# Patient Record
Sex: Female | Born: 1980 | Hispanic: No | Marital: Married | Smoking: Never smoker
Health system: Southern US, Community
[De-identification: ages and names within clinical notes are randomized; demographics above are authoritative.]

## PROBLEM LIST (undated history)

## (undated) DIAGNOSIS — J189 Pneumonia, unspecified organism: Secondary | ICD-10-CM

## (undated) DIAGNOSIS — B1910 Unspecified viral hepatitis B without hepatic coma: Secondary | ICD-10-CM

## (undated) DIAGNOSIS — Z98891 History of uterine scar from previous surgery: Secondary | ICD-10-CM

## (undated) DIAGNOSIS — D509 Iron deficiency anemia, unspecified: Secondary | ICD-10-CM

## (undated) HISTORY — PX: COSMETIC SURGERY: SHX468

## (undated) HISTORY — DX: Pneumonia, unspecified organism: J18.9

---

## 2007-07-05 ENCOUNTER — Other Ambulatory Visit: Admission: RE | Admit: 2007-07-05 | Discharge: 2007-07-05 | Payer: Self-pay | Admitting: Gynecology

## 2007-07-10 LAB — HIV ANTIBODY (ROUTINE TESTING W REFLEX): HIV: NONREACTIVE

## 2007-08-14 ENCOUNTER — Ambulatory Visit (HOSPITAL_COMMUNITY): Admission: RE | Admit: 2007-08-14 | Discharge: 2007-08-14 | Payer: Self-pay | Admitting: Gynecology

## 2009-04-05 ENCOUNTER — Inpatient Hospital Stay (HOSPITAL_COMMUNITY): Admission: AD | Admit: 2009-04-05 | Discharge: 2009-04-08 | Payer: Self-pay | Admitting: Obstetrics

## 2010-09-09 LAB — ABO/RH: RH Type: POSITIVE

## 2010-09-09 LAB — TYPE AND SCREEN: Antibody Screen: NEGATIVE

## 2010-09-09 LAB — RUBELLA ANTIBODY, IGM: Rubella: IMMUNE

## 2010-09-15 LAB — HIV ANTIBODY (ROUTINE TESTING W REFLEX): HIV: NONREACTIVE

## 2010-12-11 LAB — COMPREHENSIVE METABOLIC PANEL
ALT: 23 U/L (ref 0–35)
Albumin: 2.4 g/dL — ABNORMAL LOW (ref 3.5–5.2)
Alkaline Phosphatase: 245 U/L — ABNORMAL HIGH (ref 39–117)
BUN: 6 mg/dL (ref 6–23)
CO2: 21 mEq/L (ref 19–32)
GFR calc Af Amer: >60 mL/min (ref >60)
GFR calc non Af Amer: >60 mL/min (ref >60)

## 2010-12-11 LAB — CBC
HCT: 25.7 % — ABNORMAL LOW (ref 36.0–46.0)
Hemoglobin: 8.6 g/dL — ABNORMAL LOW (ref 12.0–15.0)
MCHC: 33.6 g/dL (ref 30.0–36.0)
MCHC: 33.7 g/dL (ref 30.0–36.0)
MCV: 84.8 fL (ref 78.0–100.0)
Platelets: 101 10*3/uL — ABNORMAL LOW (ref 150–400)
RBC: 3.79 MIL/uL — ABNORMAL LOW (ref 3.87–5.11)
WBC: 10 10*3/uL (ref 4.0–10.5)

## 2010-12-11 LAB — LACTATE DEHYDROGENASE: LDH: 122 U/L (ref 94–250)

## 2010-12-11 LAB — RPR: RPR Ser Ql: NONREACTIVE

## 2010-12-15 LAB — RPR: RPR: NONREACTIVE

## 2010-12-29 ENCOUNTER — Other Ambulatory Visit (HOSPITAL_COMMUNITY): Payer: Self-pay | Admitting: Obstetrics

## 2011-01-06 ENCOUNTER — Ambulatory Visit: Payer: Medicaid Other | Admitting: Dietician

## 2011-01-06 ENCOUNTER — Ambulatory Visit (HOSPITAL_COMMUNITY)
Admission: RE | Admit: 2011-01-06 | Discharge: 2011-01-06 | Disposition: A | Discharge status: Home / Self Care | Payer: Medicaid Other | Source: Ambulatory Visit | Attending: Obstetrics | Admitting: Obstetrics

## 2011-01-06 ENCOUNTER — Encounter: Payer: Medicaid Other | Attending: Obstetrics | Admitting: Dietician

## 2011-01-06 DIAGNOSIS — O99810 Abnormal glucose complicating pregnancy: Secondary | ICD-10-CM | POA: Insufficient documentation

## 2011-01-18 NOTE — H&P (Signed)
NAMENIKKY, DUBA                ACCOUNT NO.:  [id number]   MEDICAL RECORD NO.:  [id number]          PATIENT TYPE:  INP   LOCATION:  9174                          FACILITY:  [facility]   PHYSICIAN:  [name], M.D.DATE OF BIRTH:  [birth year]   DATE OF ADMISSION:  04/05/2009  DATE OF DISCHARGE:                              HISTORY & PHYSICAL   The patient is a 30 year old gravida 1, EDC April 05, 2009.  Negative  GBS.  Membranes ruptured at 7 a.m. on April 05, 2009, and at 19, she was  7 cm, 100% vertex -2 to -3 station.  Fluid was clear.  The patient was  in good labor and made no further progress.  By 3 p.m., she was still  cervically unchanged.   PHYSICAL EXAMINATION:  GENERAL:  A well-developed female in labor.  HEENT:  Negative.  LUNGS:  Clear.  HEART:  Regular rhythm.  No murmurs or gallops.  BREASTS:  No masses.  ABDOMEN:  Term size uterus on ultrasound.  Estimated fetal weight was 9  pounds 3 ounces 2 weeks prior to admission.  Therefore, there was  supposed to be fetal macrosomia.  EXTREMITIES:  Negative.           ______________________________  [name], M.D.     BAM/MEDQ  D:  04/05/2009  T:  04/06/2009  Job:  [id number]

## 2011-01-18 NOTE — Op Note (Signed)
[name], Penny Bishop                ACCOUNT NO.:  [id number]   MEDICAL RECORD NO.:  [id number]          PATIENT TYPE:  INP   LOCATION:  9147                          FACILITY:  [facility]   PHYSICIAN:  [name], M.D.DATE OF BIRTH:  03-25-1981   DATE OF PROCEDURE:  DATE OF DISCHARGE:                               OPERATIVE REPORT   PREOPERATIVE DIAGNOSIS:  Intrauterine pregnancy at term with failure to  progress in labor and failure to descend.   POSTOPERATIVE DIAGNOSIS:  Intrauterine pregnancy at term with failure to  progress in labor and failure to descend.   SURGEON:  [name], MD   ANESTHESIA:  Epidural.   PROCEDURE:  The patient was placed in the operating table in supine  position.  Abdomen prepped and draped.  Bladder emptied with Foley  catheter.  Transverse suprapubic incision made and carried down to  rectus fascia.  Fascia cleaned incised length of the incision.  Recti  muscles retracted laterally.  Peritoneum incised longitudinally.  Transverse incision made in the visceral peritoneum above the bladder.  Bladder mobilized inferiorly.  Transverse lower uterine incision made.  Fluid was clear.  The patient delivered from the OP position of a female,  Apgar 9 and 9, weighing 7 pounds 8 ounces.  The team was in attendance.  The placenta was anterior fundal, removed manually and sent to Labor and  Delivery.  Uterine cavity was cleaned with dry laps.  Uterine incision  closed in one layer with continuous suture of #1 chromic.  Hemostasis  was satisfactory.  Bladder flap reattached with 2-0 chromic.  Uterus  well contracted.  Tubes and ovaries normal.  Abdomen was closed in  layers, peritoneum continuous suture of 0 chromic, fascia continuous  suture with Dexon, skin closed with subcuticular stitch of 4-0 Monocryl.  Blood loss 500 mL.  The patient tolerated the procedure well, taken to  recovery room in good condition.            ______________________________  [name], M.D.     BAM/MEDQ  D:  04/05/2009  T:  04/06/2009  Job:  [id number]

## 2011-01-18 NOTE — Discharge Summary (Signed)
NAMESALISHA, BARDSLEY                ACCOUNT NO.:  [id number]   MEDICAL RECORD NO.:  [id number]          PATIENT TYPE:  INP   LOCATION:  9147                          [facility]ILITY[facility]  [facility]   PHYSICIAN:  [name], M.D.DATE OF BIRTH:  02-25-1981   DATE OF ADMISSION:  04/05/2009  DATE OF DISCHARGE:  04/08/2009                               DISCHARGE SUMMARY   The patient is a 30 year old primigravida, [facility] April 05, 2009.  She was  admitted on August 1 in labor at 11:35 a.m.  Membranes ruptured at 7:00  a.m.  She was 7 cm, 100% vertex, -2 to -3 station on admission and  estimated fetal weight by ultrasound a week prior to admission gave an  estimated fetal weight of 9 pounds 4 ounces.  The patient was in  adequate labor and eventually underwent a C-section because of failure  to progress in labor.  She had a female Apgar 9 and 9 from the OP position  weighing 7 pounds 8 ounces.  Blood loss was 500 mL.  Postop, she did  well.  Her hemoglobin was 8.6.  She was discharged on the third  postoperative day, ambulatory on a regular diet, to see me in 6 weeks.   DISCHARGE DIAGNOSIS:  Status post primary low transverse cesarean  section at term for failure to progress in labor.           ______________________________  [name], M.D.     BAM/MEDQ  D:  04/08/2009  T:  04/08/2009  Job:  [id number]

## 2011-01-26 ENCOUNTER — Other Ambulatory Visit (HOSPITAL_COMMUNITY): Payer: Self-pay | Admitting: Obstetrics

## 2011-01-26 DIAGNOSIS — Z0489 Encounter for examination and observation for other specified reasons: Secondary | ICD-10-CM

## 2011-01-26 DIAGNOSIS — IMO0002 Reserved for concepts with insufficient information to code with codable children: Secondary | ICD-10-CM

## 2011-01-28 ENCOUNTER — Ambulatory Visit (HOSPITAL_COMMUNITY)
Admission: RE | Admit: 2011-01-28 | Discharge: 2011-01-28 | Disposition: A | Discharge status: Home / Self Care | Payer: Medicaid Other | Source: Ambulatory Visit | Attending: Obstetrics | Admitting: Obstetrics

## 2011-01-28 DIAGNOSIS — O99810 Abnormal glucose complicating pregnancy: Secondary | ICD-10-CM | POA: Insufficient documentation

## 2011-02-01 ENCOUNTER — Ambulatory Visit (HOSPITAL_COMMUNITY)
Admission: RE | Admit: 2011-02-01 | Discharge: 2011-02-01 | Disposition: A | Discharge status: Home / Self Care | Payer: Medicaid Other | Source: Ambulatory Visit | Attending: Obstetrics | Admitting: Obstetrics

## 2011-02-01 DIAGNOSIS — O99810 Abnormal glucose complicating pregnancy: Secondary | ICD-10-CM | POA: Insufficient documentation

## 2011-02-04 ENCOUNTER — Ambulatory Visit (HOSPITAL_COMMUNITY)
Admission: RE | Admit: 2011-02-04 | Discharge: 2011-02-04 | Disposition: A | Discharge status: Home / Self Care | Payer: Medicaid Other | Source: Ambulatory Visit | Attending: Obstetrics | Admitting: Obstetrics

## 2011-02-04 DIAGNOSIS — O99810 Abnormal glucose complicating pregnancy: Secondary | ICD-10-CM | POA: Insufficient documentation

## 2011-02-08 ENCOUNTER — Ambulatory Visit (HOSPITAL_COMMUNITY)
Admission: RE | Admit: 2011-02-08 | Discharge: 2011-02-08 | Disposition: A | Discharge status: Home / Self Care | Payer: Medicaid Other | Source: Ambulatory Visit | Attending: Obstetrics | Admitting: Obstetrics

## 2011-02-08 ENCOUNTER — Other Ambulatory Visit (HOSPITAL_COMMUNITY): Payer: Medicaid Other

## 2011-02-08 DIAGNOSIS — Z3689 Encounter for other specified antenatal screening: Secondary | ICD-10-CM | POA: Insufficient documentation

## 2011-02-08 DIAGNOSIS — IMO0002 Reserved for concepts with insufficient information to code with codable children: Secondary | ICD-10-CM

## 2011-02-08 DIAGNOSIS — O99810 Abnormal glucose complicating pregnancy: Secondary | ICD-10-CM | POA: Insufficient documentation

## 2011-02-08 DIAGNOSIS — Z0489 Encounter for examination and observation for other specified reasons: Secondary | ICD-10-CM

## 2011-02-11 ENCOUNTER — Ambulatory Visit (HOSPITAL_COMMUNITY)
Admission: RE | Admit: 2011-02-11 | Discharge: 2011-02-11 | Disposition: A | Discharge status: Home / Self Care | Payer: Medicaid Other | Source: Ambulatory Visit | Attending: Obstetrics | Admitting: Obstetrics

## 2011-02-11 DIAGNOSIS — O99810 Abnormal glucose complicating pregnancy: Secondary | ICD-10-CM | POA: Insufficient documentation

## 2011-02-15 ENCOUNTER — Ambulatory Visit (HOSPITAL_COMMUNITY)
Admission: RE | Admit: 2011-02-15 | Discharge: 2011-02-15 | Disposition: A | Discharge status: Home / Self Care | Payer: Medicaid Other | Source: Ambulatory Visit | Attending: Obstetrics | Admitting: Obstetrics

## 2011-02-15 DIAGNOSIS — O99810 Abnormal glucose complicating pregnancy: Secondary | ICD-10-CM | POA: Insufficient documentation

## 2011-02-18 ENCOUNTER — Ambulatory Visit (HOSPITAL_COMMUNITY)
Admission: RE | Admit: 2011-02-18 | Discharge: 2011-02-18 | Disposition: A | Discharge status: Home / Self Care | Payer: Medicaid Other | Source: Ambulatory Visit | Attending: Obstetrics | Admitting: Obstetrics

## 2011-02-18 DIAGNOSIS — O99810 Abnormal glucose complicating pregnancy: Secondary | ICD-10-CM | POA: Insufficient documentation

## 2011-02-18 LAB — GC/CHLAMYDIA PROBE AMP, GENITAL: Chlamydia: NEGATIVE

## 2011-02-22 ENCOUNTER — Ambulatory Visit (HOSPITAL_COMMUNITY)
Admission: RE | Admit: 2011-02-22 | Discharge: 2011-02-22 | Disposition: A | Discharge status: Home / Self Care | Payer: Medicaid Other | Source: Ambulatory Visit | Attending: Obstetrics | Admitting: Obstetrics

## 2011-02-22 DIAGNOSIS — O99810 Abnormal glucose complicating pregnancy: Secondary | ICD-10-CM | POA: Insufficient documentation

## 2011-02-25 ENCOUNTER — Ambulatory Visit (HOSPITAL_COMMUNITY)
Admission: RE | Admit: 2011-02-25 | Discharge: 2011-02-25 | Disposition: A | Discharge status: Home / Self Care | Payer: Medicaid Other | Source: Ambulatory Visit | Attending: Obstetrics | Admitting: Obstetrics

## 2011-02-25 ENCOUNTER — Other Ambulatory Visit (HOSPITAL_COMMUNITY): Payer: Self-pay | Admitting: Obstetrics

## 2011-02-25 DIAGNOSIS — O288 Other abnormal findings on antenatal screening of mother: Secondary | ICD-10-CM

## 2011-02-25 DIAGNOSIS — O99810 Abnormal glucose complicating pregnancy: Secondary | ICD-10-CM | POA: Insufficient documentation

## 2011-03-01 ENCOUNTER — Ambulatory Visit (HOSPITAL_COMMUNITY)
Admission: RE | Admit: 2011-03-01 | Discharge: 2011-03-01 | Disposition: A | Discharge status: Home / Self Care | Payer: Medicaid Other | Source: Ambulatory Visit | Attending: Obstetrics | Admitting: Obstetrics

## 2011-03-01 ENCOUNTER — Encounter (HOSPITAL_COMMUNITY): Payer: Self-pay

## 2011-03-01 DIAGNOSIS — Z3689 Encounter for other specified antenatal screening: Secondary | ICD-10-CM | POA: Insufficient documentation

## 2011-03-01 DIAGNOSIS — O288 Other abnormal findings on antenatal screening of mother: Secondary | ICD-10-CM

## 2011-03-01 DIAGNOSIS — O99810 Abnormal glucose complicating pregnancy: Secondary | ICD-10-CM | POA: Insufficient documentation

## 2011-03-04 ENCOUNTER — Ambulatory Visit (HOSPITAL_COMMUNITY)
Admission: RE | Admit: 2011-03-04 | Discharge: 2011-03-04 | Disposition: A | Discharge status: Home / Self Care | Payer: Medicaid Other | Source: Ambulatory Visit | Attending: Obstetrics | Admitting: Obstetrics

## 2011-03-04 DIAGNOSIS — O99810 Abnormal glucose complicating pregnancy: Secondary | ICD-10-CM | POA: Insufficient documentation

## 2011-03-08 ENCOUNTER — Ambulatory Visit (HOSPITAL_COMMUNITY)
Admission: RE | Admit: 2011-03-08 | Discharge: 2011-03-08 | Disposition: A | Discharge status: Home / Self Care | Payer: Medicaid Other | Source: Ambulatory Visit | Attending: Obstetrics | Admitting: Obstetrics

## 2011-03-08 DIAGNOSIS — O99810 Abnormal glucose complicating pregnancy: Secondary | ICD-10-CM | POA: Insufficient documentation

## 2011-03-11 ENCOUNTER — Ambulatory Visit (HOSPITAL_COMMUNITY)
Admission: RE | Admit: 2011-03-11 | Discharge: 2011-03-11 | Disposition: A | Discharge status: Home / Self Care | Payer: Medicaid Other | Source: Ambulatory Visit | Attending: Obstetrics | Admitting: Obstetrics

## 2011-03-11 DIAGNOSIS — O99810 Abnormal glucose complicating pregnancy: Secondary | ICD-10-CM | POA: Insufficient documentation

## 2011-03-11 DIAGNOSIS — O34219 Maternal care for unspecified type scar from previous cesarean delivery: Secondary | ICD-10-CM | POA: Insufficient documentation

## 2011-03-11 DIAGNOSIS — O288 Other abnormal findings on antenatal screening of mother: Secondary | ICD-10-CM

## 2011-03-15 ENCOUNTER — Ambulatory Visit (HOSPITAL_COMMUNITY)
Admission: RE | Admit: 2011-03-15 | Discharge: 2011-03-15 | Disposition: A | Discharge status: Home / Self Care | Payer: Medicaid Other | Source: Ambulatory Visit | Attending: Obstetrics | Admitting: Obstetrics

## 2011-03-15 ENCOUNTER — Encounter (HOSPITAL_COMMUNITY): Payer: Self-pay

## 2011-03-15 DIAGNOSIS — O288 Other abnormal findings on antenatal screening of mother: Secondary | ICD-10-CM

## 2011-03-15 DIAGNOSIS — Z3689 Encounter for other specified antenatal screening: Secondary | ICD-10-CM | POA: Insufficient documentation

## 2011-03-15 DIAGNOSIS — O34219 Maternal care for unspecified type scar from previous cesarean delivery: Secondary | ICD-10-CM | POA: Insufficient documentation

## 2011-03-15 DIAGNOSIS — O99810 Abnormal glucose complicating pregnancy: Secondary | ICD-10-CM | POA: Insufficient documentation

## 2011-03-15 NOTE — ED Notes (Signed)
Pt here for NST and [place].  Reports positive fetal movement and denies any complaints.

## 2011-03-18 ENCOUNTER — Encounter (HOSPITAL_COMMUNITY): Payer: Self-pay | Admitting: *Deleted

## 2011-03-18 ENCOUNTER — Inpatient Hospital Stay (HOSPITAL_COMMUNITY)
Admission: AD | Admit: 2011-03-18 | Discharge: 2011-03-23 | DRG: 765 | Disposition: A | Discharge status: Home / Self Care | Payer: Medicaid Other | Source: Ambulatory Visit | Attending: Obstetrics | Admitting: Obstetrics

## 2011-03-18 ENCOUNTER — Ambulatory Visit (HOSPITAL_COMMUNITY)
Admission: RE | Admit: 2011-03-18 | Discharge: 2011-03-18 | Disposition: A | Discharge status: Home / Self Care | Payer: Medicaid Other | Source: Ambulatory Visit | Attending: Obstetrics | Admitting: Obstetrics

## 2011-03-18 ENCOUNTER — Inpatient Hospital Stay (HOSPITAL_COMMUNITY): Admission: RE | Admit: 2011-03-18 | Payer: Medicaid Other | Source: Ambulatory Visit

## 2011-03-18 DIAGNOSIS — O330 Maternal care for disproportion due to deformity of maternal pelvic bones: Principal | ICD-10-CM | POA: Diagnosis present

## 2011-03-18 DIAGNOSIS — O99814 Abnormal glucose complicating childbirth: Secondary | ICD-10-CM | POA: Diagnosis present

## 2011-03-18 DIAGNOSIS — O99810 Abnormal glucose complicating pregnancy: Secondary | ICD-10-CM | POA: Insufficient documentation

## 2011-03-18 DIAGNOSIS — Z98891 History of uterine scar from previous surgery: Secondary | ICD-10-CM

## 2011-03-18 DIAGNOSIS — O339 Maternal care for disproportion, unspecified: Secondary | ICD-10-CM | POA: Diagnosis present

## 2011-03-18 DIAGNOSIS — O34219 Maternal care for unspecified type scar from previous cesarean delivery: Secondary | ICD-10-CM | POA: Diagnosis present

## 2011-03-18 DIAGNOSIS — O24410 Gestational diabetes mellitus in pregnancy, diet controlled: Secondary | ICD-10-CM

## 2011-03-18 HISTORY — DX: History of uterine scar from previous surgery: Z98.891

## 2011-03-18 LAB — CBC
HCT: 32.3 % — ABNORMAL LOW (ref 36.0–46.0)
MCH: 26.4 pg (ref 26.0–34.0)
MCHC: 32.2 g/dL (ref 30.0–36.0)
MCV: 82 fL (ref 78.0–100.0)
Platelets: 150 10*3/uL (ref 150–400)
RBC: 3.94 MIL/uL (ref 3.87–5.11)
RDW: 17.3 % — ABNORMAL HIGH (ref 11.5–15.5)

## 2011-03-18 MED ORDER — LIDOCAINE HCL (PF) 1 % IJ SOLN
30.0000 mL | INTRAMUSCULAR | Status: DC | PRN
  Filled 2011-03-18: qty 30

## 2011-03-18 MED ORDER — PHENYLEPHRINE 40 MCG/ML (10ML) SYRINGE FOR IV PUSH (FOR BLOOD PRESSURE SUPPORT): 80.0000 ug | PREFILLED_SYRINGE | INTRAVENOUS | Status: DC | PRN

## 2011-03-18 MED ORDER — LACTATED RINGERS IV SOLN
500.0000 mL | Freq: Once | INTRAVENOUS | Status: AC
  Administered 2011-03-19: 500 mL via INTRAVENOUS

## 2011-03-18 MED ORDER — FENTANYL 2.5 MCG/ML BUPIVACAINE 1/10 % EPIDURAL INFUSION (WH - ANES)
2.0000 mL/h | INTRAMUSCULAR | Status: DC
  Administered 2011-03-19 (×7): 12 mL/h via EPIDURAL
  Filled 2011-03-18 (×7): qty 60

## 2011-03-18 MED ORDER — ONDANSETRON HCL 4 MG/2ML IJ SOLN: 4.0000 mg | Freq: Four times a day (QID) | INTRAMUSCULAR | Status: DC | PRN

## 2011-03-18 MED ORDER — EPHEDRINE 5 MG/ML INJ: 10.0000 mg | INTRAVENOUS | Status: DC | PRN

## 2011-03-18 MED ORDER — NALBUPHINE HCL 10 MG/ML IJ SOLN: 10.0000 mg | INTRAMUSCULAR | Status: DC | PRN

## 2011-03-18 MED ORDER — LACTATED RINGERS IV SOLN
INTRAVENOUS | Status: DC
  Administered 2011-03-18 – 2011-03-19 (×2): via INTRAVENOUS

## 2011-03-18 MED ORDER — TERBUTALINE SULFATE 1 MG/ML IJ SOLN: 0.2500 mg | Freq: Once | INTRAMUSCULAR | Status: AC | PRN

## 2011-03-18 MED ORDER — ACETAMINOPHEN 325 MG PO TABS
650.0000 mg | ORAL_TABLET | ORAL | Status: DC | PRN
  Administered 2011-03-19: 650 mg via ORAL
  Filled 2011-03-18: qty 2

## 2011-03-18 MED ORDER — CITRIC ACID-SODIUM CITRATE 334-500 MG/5ML PO SOLN
30.0000 mL | ORAL | Status: DC | PRN
  Administered 2011-03-18 – 2011-03-20 (×2): 30 mL via ORAL
  Filled 2011-03-18 (×3): qty 15

## 2011-03-18 MED ORDER — EPHEDRINE 5 MG/ML INJ
10.0000 mg | INTRAVENOUS | Status: DC | PRN
  Filled 2011-03-18: qty 4

## 2011-03-18 MED ORDER — OXYTOCIN 20 UNITS IN LACTATED RINGERS INFUSION - SIMPLE
1.0000 m[IU]/min | INTRAVENOUS | Status: DC
  Administered 2011-03-18: 1 m[IU]/min via INTRAVENOUS
  Filled 2011-03-18: qty 1000

## 2011-03-18 MED ORDER — PHENYLEPHRINE 40 MCG/ML (10ML) SYRINGE FOR IV PUSH (FOR BLOOD PRESSURE SUPPORT)
80.0000 ug | PREFILLED_SYRINGE | INTRAVENOUS | Status: DC | PRN
  Filled 2011-03-18: qty 5

## 2011-03-18 MED ORDER — OXYCODONE-ACETAMINOPHEN 5-325 MG PO TABS: 2.0000 | ORAL_TABLET | ORAL | Status: DC | PRN

## 2011-03-18 MED ORDER — LACTATED RINGERS IV SOLN
500.0000 mL | INTRAVENOUS | Status: DC | PRN
  Administered 2011-03-19: 1000 mL via INTRAVENOUS

## 2011-03-18 MED ORDER — IBUPROFEN 600 MG PO TABS: 600.0000 mg | ORAL_TABLET | Freq: Four times a day (QID) | ORAL | Status: DC | PRN

## 2011-03-18 MED ORDER — DIPHENHYDRAMINE HCL 50 MG/ML IJ SOLN
12.5000 mg | INTRAMUSCULAR | Status: DC | PRN
Start: 2011-03-18 — End: 2011-03-20

## 2011-03-18 NOTE — Plan of Care (Signed)
Problem: Consults Goal: Birthing Suites Patient Information Press F2 to bring up selections list Outcome: Completed/Met Date Met:  03/18/11  Pt 37-[redacted] weeks EGA, Inpatient induction and Diabetic

## 2011-03-19 ENCOUNTER — Encounter (HOSPITAL_COMMUNITY): Payer: Self-pay | Admitting: *Deleted

## 2011-03-19 ENCOUNTER — Encounter (HOSPITAL_COMMUNITY): Payer: Self-pay | Admitting: Anesthesiology

## 2011-03-19 LAB — RPR: RPR Ser Ql: NONREACTIVE

## 2011-03-19 MED ORDER — FAMOTIDINE 20 MG PO TABS
20.0000 mg | ORAL_TABLET | Freq: Two times a day (BID) | ORAL | Status: DC
  Administered 2011-03-19 (×2): 20 mg via ORAL
  Filled 2011-03-19 (×2): qty 1

## 2011-03-19 MED ORDER — NALBUPHINE SYRINGE 5 MG/0.5 ML
10.0000 mg | INJECTION | INTRAMUSCULAR | Status: DC | PRN
  Administered 2011-03-19: 10 mg via INTRAVENOUS
  Filled 2011-03-19: qty 1

## 2011-03-19 MED ORDER — PHENYLEPHRINE 40 MCG/ML (10ML) SYRINGE FOR IV PUSH (FOR BLOOD PRESSURE SUPPORT): 80.0000 ug | PREFILLED_SYRINGE | INTRAVENOUS | Status: DC | PRN

## 2011-03-19 MED ORDER — LACTATED RINGERS IV SOLN: 500.0000 mL | Freq: Once | INTRAVENOUS | Status: DC

## 2011-03-19 MED ORDER — LIDOCAINE HCL 1.5 % IJ SOLN
INTRAMUSCULAR | Status: DC | PRN
  Administered 2011-03-19: 4 mL via INTRADERMAL
  Administered 2011-03-19: 3 mL

## 2011-03-19 MED ORDER — EPHEDRINE 5 MG/ML INJ: 10.0000 mg | INTRAVENOUS | Status: DC | PRN

## 2011-03-19 MED ORDER — AMPICILLIN SODIUM 2 G IJ SOLR: 2.0000 g | Freq: Four times a day (QID) | INTRAMUSCULAR | Status: DC

## 2011-03-19 MED ORDER — FENTANYL 2.5 MCG/ML BUPIVACAINE 1/10 % EPIDURAL INFUSION (WH - ANES): 2.0000 mL/h | INTRAMUSCULAR | Status: DC

## 2011-03-19 MED ORDER — SODIUM CHLORIDE 0.9 % IV SOLN
2.0000 g | Freq: Four times a day (QID) | INTRAVENOUS | Status: DC
  Administered 2011-03-19: 2 g via INTRAVENOUS
  Filled 2011-03-19: qty 2000

## 2011-03-19 MED ORDER — LACTATED RINGERS IV SOLN: 500.0000 mL | INTRAVENOUS | Status: DC | PRN

## 2011-03-19 NOTE — Progress Notes (Signed)
  Patient's cervix is now 3-4 cm dilated 90% effaced Vertex at -2-3 station She is contracting every 2-3 minutes good quality

## 2011-03-19 NOTE — Anesthesia Preprocedure Evaluation (Addendum)
Anesthesia Evaluation  Name, MR# and DOB Patient awake  General Assessment Comment  Reviewed: Allergy & Precautions, H&P  and Patient's Chart, lab work & pertinent test results  Airway Mallampati: III TM Distance: >3 FB Neck ROM: full    Dental No notable dental hx (+) Teeth Intact   Pulmonary    pulmonary exam normal   Cardiovascular regular Normal   Neuro/PsychNegative Neurological ROS Negative Psych ROS  GI/Hepatic/Renal negative GI ROS, negative Liver ROS, and negative Renal ROS (+)       Endo/Other  Negative Endocrine ROS (+)   Abdominal   Musculoskeletal  Hematology negative hematology ROS (+)   Peds  Reproductive/Obstetrics (+) Pregnancy   Anesthesia Other Findings             Anesthesia Physical Anesthesia Plan  ASA: II  Anesthesia Plan: Epidural   Post-op Pain Management:    Induction:   Airway Management Planned:   Additional Equipment:   Intra-op Plan:   Post-operative Plan:   Informed Consent: I have reviewed the patients History and Physical, chart, labs and discussed the procedure including the risks, benefits and alternatives for the proposed anesthesia with the patient or authorized representative who has indicated his/her understanding and acceptance.     Plan Discussed with: Anesthesiologist  Anesthesia Plan Comments:         Anesthesia Quick Evaluation

## 2011-03-19 NOTE — H&P (Signed)
This is Dr. Francoise Ceo dictating the admitting note on Penny Bishop She's a 30 year old gravida 3 para 1011 EDC 03/22/2011 The patient is a diabetic controlled on diet and her sugars have been normal She had a previous C-section for CPD And is now admitted for induction of labor at 39+ weeks or G. Her GBS was negative and her HIV negative Past medical history she had a previous C-section Social history denies smoking drinking or alcohol abuse Family history noncontributory System review noncontributory Physical HEENT negative Breasts negative Heart regular rhythm no murmurs no gallops Lungs clear to P&A Abdomen term size with an estimated fetal weight of 8 pounds Cervix was 2 cm 80% and the vertex -2-3 station The membranes were ruptured artificially and the fluid was clear

## 2011-03-19 NOTE — Anesthesia Procedure Notes (Signed)
Epidural Patient location during procedure: OB Start time: 03/19/2011 2:12 AM  Staffing Anesthesiologist: Kahner Yanik A. Performed by: anesthesiologist   Preanesthetic Checklist Completed: patient identified, site marked, surgical consent, pre-op evaluation, timeout performed, IV checked, risks and benefits discussed and monitors and equipment checked  Epidural Patient position: sitting Prep: site prepped and draped and DuraPrep Patient monitoring: continuous pulse ox and blood pressure Approach: midline Injection technique: LOR air  Needle Needle type: Tuohy  Needle gauge: 17 G Needle length: 9 cm Catheter type: closed end flexible Catheter size: 19 Gauge Test dose: negative and 1.5% lidocaine  Assessment Events: blood not aspirated, injection not painful, no injection resistance, negative IV test and no paresthesia

## 2011-03-20 ENCOUNTER — Encounter (HOSPITAL_COMMUNITY): Payer: Self-pay | Admitting: Anesthesiology

## 2011-03-20 ENCOUNTER — Encounter (HOSPITAL_COMMUNITY): Payer: Self-pay | Admitting: Neonatology

## 2011-03-20 ENCOUNTER — Inpatient Hospital Stay (HOSPITAL_COMMUNITY): Payer: Medicaid Other | Admitting: Anesthesiology

## 2011-03-20 ENCOUNTER — Other Ambulatory Visit: Payer: Self-pay | Admitting: Obstetrics

## 2011-03-20 ENCOUNTER — Encounter (HOSPITAL_COMMUNITY)
Admission: AD | Disposition: A | Discharge status: Home / Self Care | Payer: Self-pay | Source: Ambulatory Visit | Attending: Obstetrics

## 2011-03-20 DIAGNOSIS — O34219 Maternal care for unspecified type scar from previous cesarean delivery: Secondary | ICD-10-CM | POA: Diagnosis present

## 2011-03-20 DIAGNOSIS — O24410 Gestational diabetes mellitus in pregnancy, diet controlled: Secondary | ICD-10-CM

## 2011-03-20 SURGERY — Surgical Case
Anesthesia: Regional | Site: Abdomen | Wound class: Clean Contaminated

## 2011-03-20 MED ORDER — NALOXONE HCL 0.4 MG/ML IJ SOLN: 0.4000 mg | INTRAMUSCULAR | Status: DC | PRN

## 2011-03-20 MED ORDER — CEFAZOLIN SODIUM 1-5 GM-% IV SOLN
1.0000 g | Freq: Three times a day (TID) | INTRAVENOUS | Status: AC
  Administered 2011-03-20 (×2): 1 g via INTRAVENOUS
  Filled 2011-03-20 (×3): qty 50

## 2011-03-20 MED ORDER — SODIUM BICARBONATE 8.4 % IV SOLN
INTRAVENOUS | Status: DC | PRN
  Administered 2011-03-19: 10 mL via EPIDURAL

## 2011-03-20 MED ORDER — SODIUM CHLORIDE 0.9 % IJ SOLN: 9.0000 mL | INTRAMUSCULAR | Status: DC | PRN

## 2011-03-20 MED ORDER — PRENATAL VITAMINS (DIS) PO TABS: 1.0000 | ORAL_TABLET | ORAL | Status: DC

## 2011-03-20 MED ORDER — SIMETHICONE 80 MG PO CHEW: 80.0000 mg | CHEWABLE_TABLET | ORAL | Status: DC | PRN

## 2011-03-20 MED ORDER — ONDANSETRON HCL 4 MG PO TABS: 4.0000 mg | ORAL_TABLET | ORAL | Status: DC | PRN

## 2011-03-20 MED ORDER — OXYTOCIN 10 UNIT/ML IJ SOLN
INTRAMUSCULAR | Status: AC
  Filled 2011-03-20: qty 4

## 2011-03-20 MED ORDER — LIDOCAINE-EPINEPHRINE (PF) 2 %-1:200000 IJ SOLN
INTRAMUSCULAR | Status: AC
  Filled 2011-03-20: qty 20

## 2011-03-20 MED ORDER — MORPHINE SULFATE 0.5 MG/ML IJ SOLN
INTRAMUSCULAR | Status: AC
  Filled 2011-03-20: qty 10

## 2011-03-20 MED ORDER — LACTATED RINGERS IV SOLN
INTRAVENOUS | Status: DC | PRN
  Administered 2011-03-20 (×2): via INTRAVENOUS

## 2011-03-20 MED ORDER — ZOLPIDEM TARTRATE 5 MG PO TABS: 5.0000 mg | ORAL_TABLET | Freq: Every evening | ORAL | Status: DC | PRN

## 2011-03-20 MED ORDER — FENTANYL CITRATE 0.05 MG/ML IJ SOLN
INTRAMUSCULAR | Status: DC | PRN
  Administered 2011-03-20 (×2): 50 ug via INTRAVENOUS

## 2011-03-20 MED ORDER — CEFAZOLIN SODIUM 1 G IJ SOLR: 1.0000 g | Freq: Three times a day (TID) | INTRAMUSCULAR | Status: DC

## 2011-03-20 MED ORDER — FENTANYL CITRATE 0.05 MG/ML IJ SOLN
INTRAMUSCULAR | Status: AC
  Filled 2011-03-20: qty 2

## 2011-03-20 MED ORDER — OXYTOCIN 20 UNITS IN LACTATED RINGERS INFUSION - SIMPLE
INTRAVENOUS | Status: DC | PRN
  Administered 2011-03-20: 20 [IU] via INTRAVENOUS

## 2011-03-20 MED ORDER — PHENYLEPHRINE 40 MCG/ML (10ML) SYRINGE FOR IV PUSH (FOR BLOOD PRESSURE SUPPORT)
PREFILLED_SYRINGE | INTRAVENOUS | Status: AC
  Filled 2011-03-20: qty 10

## 2011-03-20 MED ORDER — OXYCODONE-ACETAMINOPHEN 5-325 MG PO TABS
1.0000 | ORAL_TABLET | ORAL | Status: DC | PRN
  Administered 2011-03-21 – 2011-03-23 (×5): 1 via ORAL
  Filled 2011-03-20 (×5): qty 1

## 2011-03-20 MED ORDER — ONDANSETRON HCL 4 MG/2ML IJ SOLN: 4.0000 mg | INTRAMUSCULAR | Status: DC | PRN

## 2011-03-20 MED ORDER — HYDROMORPHONE HCL 2 MG/ML IJ SOLN: 2.0000 mg | Freq: Once | INTRAMUSCULAR | Status: DC

## 2011-03-20 MED ORDER — HYDROMORPHONE HCL 1 MG/ML IJ SOLN
0.5000 mg | INTRAMUSCULAR | Status: DC | PRN
  Administered 2011-03-20: 0.5 mg via INTRAVENOUS
  Administered 2011-03-20: 1 mg via INTRAVENOUS

## 2011-03-20 MED ORDER — PRENATAL PLUS 27-1 MG PO TABS
1.0000 | ORAL_TABLET | Freq: Every day | ORAL | Status: DC
  Administered 2011-03-20: 1 via ORAL

## 2011-03-20 MED ORDER — SODIUM CHLORIDE 0.9 % IV SOLN
150.0000 mg | INTRAVENOUS | Status: DC | PRN
  Administered 2011-03-20: 25 mg via INTRAVENOUS

## 2011-03-20 MED ORDER — OXYTOCIN 20 UNITS IN LACTATED RINGERS INFUSION - SIMPLE: 125.0000 mL/h | INTRAVENOUS | Status: AC

## 2011-03-20 MED ORDER — HYDROMORPHONE 0.3 MG/ML IV SOLN
INTRAVENOUS | Status: AC
  Filled 2011-03-20: qty 25

## 2011-03-20 MED ORDER — MENTHOL 3 MG MT LOZG: 1.0000 | LOZENGE | OROMUCOSAL | Status: DC | PRN

## 2011-03-20 MED ORDER — MORPHINE SULFATE (PF) 0.5 MG/ML IJ SOLN
INTRAMUSCULAR | Status: DC | PRN
  Administered 2011-03-20: 4 mg via EPIDURAL
  Administered 2011-03-20: 1 mg via INTRAVENOUS

## 2011-03-20 MED ORDER — SENNOSIDES-DOCUSATE SODIUM 8.6-50 MG PO TABS
1.0000 | ORAL_TABLET | Freq: Every day | ORAL | Status: DC
  Administered 2011-03-20 – 2011-03-21 (×2): 1 via ORAL
  Administered 2011-03-22: 2 via ORAL

## 2011-03-20 MED ORDER — DIPHENHYDRAMINE HCL 25 MG PO CAPS: 25.0000 mg | ORAL_CAPSULE | Freq: Four times a day (QID) | ORAL | Status: DC | PRN

## 2011-03-20 MED ORDER — CEFAZOLIN SODIUM 1-5 GM-% IV SOLN
INTRAVENOUS | Status: DC | PRN
  Administered 2011-03-20: 1 g via INTRAVENOUS

## 2011-03-20 MED ORDER — PRENATAL PLUS 27-1 MG PO TABS
1.0000 | ORAL_TABLET | Freq: Every day | ORAL | Status: DC
  Administered 2011-03-20 – 2011-03-23 (×4): 1 via ORAL
  Filled 2011-03-20 (×4): qty 1

## 2011-03-20 MED ORDER — IBUPROFEN 200 MG PO TABS
600.0000 mg | ORAL_TABLET | Freq: Four times a day (QID) | ORAL | Status: DC
  Administered 2011-03-20 – 2011-03-23 (×11): 600 mg via ORAL
  Filled 2011-03-20: qty 3
  Filled 2011-03-20 (×5): qty 1
  Filled 2011-03-20 (×2): qty 3
  Filled 2011-03-20 (×7): qty 1

## 2011-03-20 MED ORDER — SODIUM CHLORIDE 0.9 % IV SOLN: 2.0000 mg/h | Freq: Once | INTRAVENOUS | Status: DC

## 2011-03-20 MED ORDER — ONDANSETRON HCL 4 MG/2ML IJ SOLN
INTRAMUSCULAR | Status: DC | PRN
  Administered 2011-03-20: 4 mg via INTRAVENOUS

## 2011-03-20 MED ORDER — FAMOTIDINE 10 MG PO TABS
10.0000 mg | ORAL_TABLET | Freq: Two times a day (BID) | ORAL | Status: DC
  Administered 2011-03-20 (×2): 10 mg via ORAL
  Filled 2011-03-20: qty 1
  Filled 2011-03-20: qty 0.5
  Filled 2011-03-20 (×2): qty 1

## 2011-03-20 MED ORDER — PHENYLEPHRINE HCL 10 MG/ML IJ SOLN
INTRAMUSCULAR | Status: DC | PRN
  Administered 2011-03-20: 80 ug via INTRAVENOUS
  Administered 2011-03-20: 40 ug via INTRAVENOUS
  Administered 2011-03-20 (×2): 80 ug via INTRAVENOUS
  Administered 2011-03-20: 120 ug via INTRAVENOUS

## 2011-03-20 MED ORDER — HYDROMORPHONE HCL 1 MG/ML IJ SOLN
INTRAMUSCULAR | Status: AC
Start: 2011-03-20 — End: 2011-03-20
  Administered 2011-03-20: 1 mg via INTRAVENOUS
  Filled 2011-03-20: qty 1

## 2011-03-20 MED ORDER — ONDANSETRON HCL 4 MG/2ML IJ SOLN: 4.0000 mg | Freq: Four times a day (QID) | INTRAMUSCULAR | Status: DC | PRN

## 2011-03-20 MED ORDER — MEPERIDINE HCL 25 MG/ML IJ SOLN
INTRAMUSCULAR | Status: AC
  Filled 2011-03-20: qty 1

## 2011-03-20 MED ORDER — LIDOCAINE HCL (PF) 2 % IJ SOLN
INTRAMUSCULAR | Status: DC | PRN
  Administered 2011-03-20: 100 mg

## 2011-03-20 MED ORDER — ONDANSETRON HCL 4 MG/2ML IJ SOLN
INTRAMUSCULAR | Status: AC
  Filled 2011-03-20: qty 2

## 2011-03-20 MED ORDER — DIPHENHYDRAMINE HCL 50 MG/ML IJ SOLN: 12.5000 mg | Freq: Four times a day (QID) | INTRAMUSCULAR | Status: DC | PRN

## 2011-03-20 MED ORDER — HYDROMORPHONE HCL 1 MG/ML IJ SOLN
INTRAMUSCULAR | Status: AC
  Administered 2011-03-20: 0.5 mg via INTRAVENOUS
  Filled 2011-03-20: qty 1

## 2011-03-20 MED ORDER — DIPHENHYDRAMINE HCL 12.5 MG/5ML PO ELIX
12.5000 mg | ORAL_SOLUTION | Freq: Four times a day (QID) | ORAL | Status: DC | PRN
  Filled 2011-03-20: qty 5

## 2011-03-20 MED ORDER — CEFAZOLIN SODIUM 1-5 GM-% IV SOLN
INTRAVENOUS | Status: AC
  Filled 2011-03-20: qty 50

## 2011-03-20 MED ORDER — SIMETHICONE 80 MG PO CHEW
80.0000 mg | CHEWABLE_TABLET | Freq: Three times a day (TID) | ORAL | Status: DC
  Administered 2011-03-20 – 2011-03-23 (×13): 80 mg via ORAL

## 2011-03-20 MED ORDER — HYDROMORPHONE 0.3 MG/ML IV SOLN
INTRAVENOUS | Status: DC
  Administered 2011-03-20: 07:00:00 via INTRAVENOUS
  Administered 2011-03-20: 2.7 mg via INTRAVENOUS
  Administered 2011-03-20: 1.2 mg via INTRAVENOUS
  Administered 2011-03-20: 10 mg via INTRAVENOUS
  Administered 2011-03-21: 0.6 mg via INTRAVENOUS
  Administered 2011-03-21: 0.3 mg via INTRAVENOUS

## 2011-03-20 MED ORDER — CALCIUM CARBONATE ANTACID 500 MG PO CHEW
2.0000 | CHEWABLE_TABLET | Freq: Three times a day (TID) | ORAL | Status: DC
  Administered 2011-03-20 – 2011-03-23 (×4): 400 mg via ORAL
  Filled 2011-03-20 (×4): qty 2

## 2011-03-20 SURGICAL SUPPLY — 32 items
CLOTH BEACON ORANGE TIMEOUT ST (SAFETY) ×2 IMPLANT
CONTAINER PREFILL 10% NBF 15ML (MISCELLANEOUS) IMPLANT
DERMABOND ADVANCED (GAUZE/BANDAGES/DRESSINGS) ×2 IMPLANT
DRAPE UTILITY XL STRL (DRAPES) ×2 IMPLANT
DRESSING TELFA 8X3 (GAUZE/BANDAGES/DRESSINGS) IMPLANT
ELECT REM PT RETURN 9FT ADLT (ELECTROSURGICAL) ×2
ELECTRODE REM PT RTRN 9FT ADLT (ELECTROSURGICAL) ×1 IMPLANT
EXTRACTOR VACUUM M CUP 4 TUBE (SUCTIONS) IMPLANT
GAUZE SPONGE 4X4 12PLY STRL LF (GAUZE/BANDAGES/DRESSINGS) IMPLANT
GLOVE BIO SURGEON STRL SZ8.5 (GLOVE) ×4 IMPLANT
GLOVE BIOGEL PI IND STRL 7.5 (GLOVE) ×1 IMPLANT
GLOVE BIOGEL PI INDICATOR 7.5 (GLOVE) ×1
GOWN BRE IMP SLV AUR LG STRL (GOWN DISPOSABLE) ×4 IMPLANT
GOWN STRL REIN 2XL LVL4 (GOWN DISPOSABLE) ×2 IMPLANT
KIT ABG SYR 3ML LUER SLIP (SYRINGE) ×2 IMPLANT
NEEDLE HYPO 25X5/8 SAFETYGLIDE (NEEDLE) ×2 IMPLANT
NS IRRIG 1000ML POUR BTL (IV SOLUTION) ×2 IMPLANT
PACK C SECTION WH (CUSTOM PROCEDURE TRAY) ×2 IMPLANT
PAD ABD 7.5X8 STRL (GAUZE/BANDAGES/DRESSINGS) IMPLANT
SLEEVE SCD COMPRESS KNEE MED (MISCELLANEOUS) ×2 IMPLANT
SUT CHROMIC 0 CT 802H (SUTURE) ×2 IMPLANT
SUT CHROMIC 1 CTX 36 (SUTURE) ×6 IMPLANT
SUT CHROMIC 2 0 SH (SUTURE) ×2 IMPLANT
SUT GUT PLAIN 0 CT-3 TAN 27 (SUTURE) IMPLANT
SUT MON AB 4-0 PS1 27 (SUTURE) ×2 IMPLANT
SUT VIC AB 0 CT1 18XCR BRD8 (SUTURE) IMPLANT
SUT VIC AB 0 CT1 8-18 (SUTURE)
SUT VIC AB 0 CTX 36 (SUTURE) ×2
SUT VIC AB 0 CTX36XBRD ANBCTRL (SUTURE) ×2 IMPLANT
TOWEL OR 17X24 6PK STRL BLUE (TOWEL DISPOSABLE) ×4 IMPLANT
TRAY FOLEY CATH 14FR (SET/KITS/TRAYS/PACK) IMPLANT
WATER STERILE IRR 1000ML POUR (IV SOLUTION) ×2 IMPLANT

## 2011-03-20 NOTE — Anesthesia Postprocedure Evaluation (Signed)
  Anesthesia Post-op Note  Patient: Penny Bishop  Procedure(s) Performed:  CESAREAN SECTION  Patient admitted to PACU. Vital signs stable. Respirations stable. No complaint of pain. No post-anesthesia complication noted.

## 2011-03-20 NOTE — Op Note (Signed)
This is Dr. Francoise Ceo dictating the operative note on Dakia Rane Preop diagnosis previous cesarean section at term in labor CPD Maternal fever in labor Gestational diabetes controlled by diet Postop diagnosis the same Anesthesia epidural Procedure patient placed on the operating table in the supine position Abdomen prepped and draped bladder emptied with a Foley catheter A transverse incision was made through the old scar carried down to the fascia The fascia cleaned and incised the length of the incision The peritoneum was opened longitudinally A transverse incision was made in the visceroperitoneum above the bladder And the bladder mobilized inferiorly Transverse low uterine incision made The patient delivered from the OP position of a female Apgar 27 The cord pH was 7.18 and the weight was 7 lbs. 2 oz. The team was in attendance The placenta was removed manually and sent to pathology The uterine cavity clean with dry laps The uterine incision was closed in one layer with continuous looped abnormal one chromic Bladder flap red patch with 2-0 chromic The uterus was well contracted tubes and ovaries normal Lap and sponge counts correct The abdomen closed in layers Peritoneum continuous with 2-0 chromic fascia contiguous with 0 Dexon And the skin closed with subcutaneous subcuticular stitch of 4-0 Vicryl blood glass was 500 cc

## 2011-03-20 NOTE — Progress Notes (Signed)
BREASTFEEDING CONSULTATION/NICU INFORMATION GIVEN TO PT.  DEBP SET UP AT BEDSIDE AND PATIENT HAS PUMPED X 2 TODAY.  OBTAINED DROPS OF COLOSTRUM.  REVIEWED PUMPING FREQUENCY.  ENCOURAGED TO CALL WITH QUESTIONS/CONCERNS.

## 2011-03-20 NOTE — Plan of Care (Signed)
Problem: Phase I Progression Outcomes Goal: FHR checked 5 minutes after meds (ROM) Rupture of Membranes Outcome: Not Progressing FHR not documented 5 minutes after AROM or 5 minutes after IV pain med given.

## 2011-03-20 NOTE — Transfer of Care (Signed)
Immediate Anesthesia Transfer of Care Note  Patient: Penny Bishop  Procedure(s) Performed:  CESAREAN SECTION  Patient Location: PACU  Anesthesia Type: Epidural  Level of Consciousness: awake, alert  and oriented  Airway & Oxygen Therapy: Patient Spontanous Breathing  Post-op Assessment: Report given to PACU RN and Post -op Vital signs reviewed and stable  Post vital signs: stable  Complications: No apparent anesthesia complications

## 2011-03-20 NOTE — Anesthesia Postprocedure Evaluation (Signed)
  Anesthesia Post-op Note  Patient: Penny Bishop  Procedure(s) Performed:  CESAREAN SECTION  Patient is awake, responsive, moving her legs, and has signs of resolution of her numbness. Pain and nausea are reasonably well controlled. Vital signs are stable and clinically acceptable. Oxygen saturation is clinically acceptable. There are no apparent anesthetic complications at this time. Patient is ready for discharge.

## 2011-03-20 NOTE — Progress Notes (Signed)
Patient became fully dilated at 10:20 PM and she  pushed For 2 hours and the vertex was molded to plus one station She also developed a temp at 8:30 PM 100.9 She received Tylenol 650 and ampicillin 2 g IV every 6 hours Fetal heart running in the 160s to 170 The patient will  be delivered by C-section because of CPD

## 2011-03-20 NOTE — Progress Notes (Signed)
  Postop day 0 Temp 100.3 otherwise vital signs normal fundus firm Incision clean Legs negative No complaints doing well Patient on Ancef 1 g IV every 8 hours x3 doses

## 2011-03-20 NOTE — Consult Note (Signed)
Asked to attend delivery of this infant at 38 5/7 wks by C/S for FTP. Labor was complicated by ROM >24hrs, fetal tachycardia, and maternal fever Tmax 102.4 treated with Amp 4 hrs PTD. At birth, infant was apneic, cyanotic, with a HR of 50/min. She was suctioned with a bulb and stimulated without response. IPPV given for  2 1/2 min with quick rise in HR to 100/min, followed by irregular resp. Infant was stimulated with onset of weak cry after 3 min of age. Apgars 2/7.  Infant weaned to room air. Pink but tachypneic with decreased tone. Due to maternal hx and need for resuscitation and persistent hypotonia, decision was made for infant to go NICU for sepsis w/u. She was shown to mom then transferred to NICU. Placenta requested to go to Pathology. Edson Snowball, MD

## 2011-03-21 MED ORDER — FAMOTIDINE IN NACL 20-0.9 MG/50ML-% IV SOLN: 20.0000 mg | Freq: Two times a day (BID) | INTRAVENOUS | Status: DC

## 2011-03-21 MED ORDER — FAMOTIDINE 20 MG PO TABS
20.0000 mg | ORAL_TABLET | Freq: Two times a day (BID) | ORAL | Status: DC
  Administered 2011-03-21 (×2): 20 mg via ORAL
  Filled 2011-03-21 (×2): qty 1

## 2011-03-21 NOTE — Progress Notes (Signed)
Mother states she is pumping every 3-3.5 hrs  And she is only getting a few gtts of colostrum.. Encouraged to pump every 3 hr. And to call wic to schedule visit to get debp. Discussed possible wic  Loaner pump.

## 2011-03-21 NOTE — Progress Notes (Signed)
  Post operative day #1 Vital signs normal Fundus firm Incision clean and dry Legs negative No complaints

## 2011-03-21 NOTE — Progress Notes (Signed)
UR chart review completed.  

## 2011-03-22 NOTE — Progress Notes (Signed)
Per pt having discomfort when pumping with 24 flange , At consult sat mom up on side of bed ,checked pumping with 24 flange with lanolin ,mom still having discomfort at base of nipples . Changed to 27 flange ,mom more comfortable , With 24 more yield ,but more comfortable with 27. Per mom also feeding infant in NICU .

## 2011-03-22 NOTE — Progress Notes (Signed)
   postoperative day 2 vio Vita ol signs normal fundus firm incision clean legs negative No complaints

## 2011-03-23 ENCOUNTER — Encounter (HOSPITAL_COMMUNITY): Payer: Self-pay | Admitting: Obstetrics

## 2011-03-23 DIAGNOSIS — Z98891 History of uterine scar from previous surgery: Secondary | ICD-10-CM

## 2011-03-23 HISTORY — DX: History of uterine scar from previous surgery: Z98.891

## 2011-03-23 MED ORDER — GUAIFENESIN 100 MG/5ML PO SOLN
5.0000 mL | ORAL | Status: DC | PRN
  Administered 2011-03-23: 100 mg via ORAL
  Filled 2011-03-23: qty 15

## 2011-03-23 MED ORDER — OXYCODONE-ACETAMINOPHEN 5-325 MG PO TABS: 1.0000 | ORAL_TABLET | ORAL | Status: AC | PRN

## 2011-03-23 NOTE — Progress Notes (Signed)
BREASTS FULL, SLIGHTLY ENGORGED.  REVIEWED IMPORTANCE OF PUMPING EVERY 3 HOURS.  ICE PACKS GIVEN TO PATIENTS TO USE FOR 15-20 MINUTES EVERY 1-2 HOURS.  PATIENT STATES BABY IS NURSING BUT SHE HAD A DIFFICULT LATCH AT LAST FEEDING DUE TO FULLNESS. ENCOURAGED PATIENT TO PUMP 3-5 MINUTES PRIOR TO LATCH TO SOFTEN BREAST SOME FOR EASIER LATCH.  INSTRUCTED TO CALL FOR CONCERNS OR ASSIST PRN.  ANTICIPATES BABY DISCHARGE FROM NICU NEXT WEEK.  PATIENT HAS DEBP AT HOME TO USE UNTIL BABY HOME.

## 2011-03-23 NOTE — Discharge Summary (Signed)
Obstetric Discharge Summary Reason for Admission: induction of labor Prenatal Procedures: none Intrapartum Procedures: cesarean: low cervical, transverse Postpartum Procedures: none Complications-Operative and Postpartum: none  Hemoglobin  Date Value Range Status  03/18/2011 10.4* 12.0-15.0 (g/dL) Final     HCT  Date Value Range Status  03/18/2011 32.3* 36.0-46.0 (%) Final    Discharge Diagnoses: Term Pregnancy-delivered  Discharge Information: Date: 03/23/2011 Activity: pelvic rest Diet: routine Medications: Tylenol #3 Condition: stable Instructions: refer to practice specific booklet Discharge to: home Follow-up Information    Follow up with Josua Ferrebee A. Call in 6 weeks.   Contact information:   [address] [place] [address] [phone]          Newborn Data: Live born  Information for the patient's newborn:  Namiko, Pritts [[id number]]  female ; APGAR , ; weight ;  .baby in nicu  Annalyce Lanpher A 03/23/2011, 4:45 AM

## 2011-03-23 NOTE — Progress Notes (Signed)
  In postop day 3 Vital signs normal Fundus firm Incision clean and dry Legs negative No complaints Home today

## 2011-03-24 NOTE — Addendum Note (Signed)
Addendum  created 03/24/11 0942 by Colen Eltzroth Edward Aris Moman   Modules edited:Anesthesia Events    

## 2011-03-24 NOTE — Addendum Note (Signed)
Addendum  created 03/24/11 0942 by Jiles Garter   Modules edited:Anesthesia Events

## 2011-04-05 ENCOUNTER — Encounter (HOSPITAL_COMMUNITY): Payer: Self-pay | Admitting: Obstetrics

## 2014-07-07 ENCOUNTER — Encounter (HOSPITAL_COMMUNITY): Payer: Self-pay | Admitting: Obstetrics

## 2014-08-13 ENCOUNTER — Ambulatory Visit
Admission: RE | Admit: 2014-08-13 | Discharge: 2014-08-13 | Disposition: A | Discharge status: Home / Self Care | Payer: BC Managed Care – PPO | Source: Ambulatory Visit | Attending: Cardiovascular Disease | Admitting: Cardiovascular Disease

## 2014-08-13 ENCOUNTER — Other Ambulatory Visit: Payer: Self-pay | Admitting: Cardiovascular Disease

## 2014-08-13 DIAGNOSIS — J40 Bronchitis, not specified as acute or chronic: Secondary | ICD-10-CM

## 2014-08-20 ENCOUNTER — Encounter (HOSPITAL_COMMUNITY): Payer: Self-pay | Admitting: General Practice

## 2014-08-20 ENCOUNTER — Inpatient Hospital Stay (HOSPITAL_COMMUNITY)
Admission: AD | Admit: 2014-08-20 | Discharge: 2014-08-24 | DRG: 195 | Disposition: A | Discharge status: Home / Self Care | Payer: BC Managed Care – PPO | Source: Ambulatory Visit | Attending: Cardiovascular Disease | Admitting: Cardiovascular Disease

## 2014-08-20 DIAGNOSIS — J129 Viral pneumonia, unspecified: Secondary | ICD-10-CM | POA: Diagnosis present

## 2014-08-20 DIAGNOSIS — B349 Viral infection, unspecified: Secondary | ICD-10-CM | POA: Diagnosis not present

## 2014-08-20 DIAGNOSIS — E860 Dehydration: Secondary | ICD-10-CM | POA: Diagnosis present

## 2014-08-20 DIAGNOSIS — J189 Pneumonia, unspecified organism: Secondary | ICD-10-CM | POA: Diagnosis not present

## 2014-08-20 DIAGNOSIS — R509 Fever, unspecified: Secondary | ICD-10-CM | POA: Diagnosis not present

## 2014-08-20 DIAGNOSIS — R519 Headache, unspecified: Secondary | ICD-10-CM

## 2014-08-20 DIAGNOSIS — R51 Headache: Secondary | ICD-10-CM

## 2014-08-20 DIAGNOSIS — D509 Iron deficiency anemia, unspecified: Secondary | ICD-10-CM | POA: Diagnosis present

## 2014-08-20 HISTORY — DX: Unspecified viral hepatitis B without hepatic coma: B19.10

## 2014-08-20 HISTORY — DX: Pneumonia, unspecified organism: J18.9

## 2014-08-20 HISTORY — DX: Iron deficiency anemia, unspecified: D50.9

## 2014-08-20 LAB — CBC WITH DIFFERENTIAL/PLATELET
BASOS ABS: 0 10*3/uL (ref 0.0–0.1)
BASOS PCT: 0 % (ref 0–1)
Eosinophils Absolute: 0.1 10*3/uL (ref 0.0–0.7)
Eosinophils Relative: 1 % (ref 0–5)
HEMATOCRIT: 36.6 % (ref 36.0–46.0)
Hemoglobin: 11.8 g/dL — ABNORMAL LOW (ref 12.0–15.0)
Lymphocytes Relative: 30 % (ref 12–46)
Lymphs Abs: 3.4 10*3/uL (ref 0.7–4.0)
MCH: 27 pg (ref 26.0–34.0)
MCHC: 32.2 g/dL (ref 30.0–36.0)
MCV: 83.8 fL (ref 78.0–100.0)
MONO ABS: 0.4 10*3/uL (ref 0.1–1.0)
Monocytes Relative: 4 % (ref 3–12)
NEUTROS ABS: 7.2 10*3/uL (ref 1.7–7.7)
NEUTROS PCT: 65 % (ref 43–77)
Platelets: 271 10*3/uL (ref 150–400)
RBC: 4.37 MIL/uL (ref 3.87–5.11)
RDW: 13.7 % (ref 11.5–15.5)
WBC: 11.1 10*3/uL — ABNORMAL HIGH (ref 4.0–10.5)

## 2014-08-20 LAB — BASIC METABOLIC PANEL
ANION GAP: 13 (ref 5–15)
BUN: 9 mg/dL (ref 6–23)
CALCIUM: 9.4 mg/dL (ref 8.4–10.5)
CO2: 25 mEq/L (ref 19–32)
CREATININE: 0.54 mg/dL (ref 0.50–1.10)
Chloride: 101 mEq/L (ref 96–112)
Glucose, Bld: 92 mg/dL (ref 70–99)
Potassium: 4.1 mEq/L (ref 3.7–5.3)
Sodium: 139 mEq/L (ref 137–147)

## 2014-08-20 MED ORDER — ACETAMINOPHEN 325 MG PO TABS
650.0000 mg | ORAL_TABLET | Freq: Four times a day (QID) | ORAL | Status: DC | PRN
  Administered 2014-08-21 – 2014-08-22 (×2): 650 mg via ORAL
  Filled 2014-08-20 (×2): qty 2

## 2014-08-20 MED ORDER — SODIUM CHLORIDE 0.9 % IV SOLN
INTRAVENOUS | Status: DC
  Administered 2014-08-22 – 2014-08-23 (×3): via INTRAVENOUS

## 2014-08-20 MED ORDER — GUAIFENESIN ER 600 MG PO TB12
600.0000 mg | ORAL_TABLET | Freq: Two times a day (BID) | ORAL | Status: DC
  Administered 2014-08-20 – 2014-08-24 (×8): 600 mg via ORAL
  Filled 2014-08-20 (×10): qty 1

## 2014-08-20 MED ORDER — ONDANSETRON HCL 4 MG/2ML IJ SOLN: 4.0000 mg | Freq: Four times a day (QID) | INTRAMUSCULAR | Status: DC | PRN

## 2014-08-20 MED ORDER — HEPARIN SODIUM (PORCINE) 5000 UNIT/ML IJ SOLN
5000.0000 [IU] | Freq: Three times a day (TID) | INTRAMUSCULAR | Status: DC
  Filled 2014-08-20 (×9): qty 1

## 2014-08-20 MED ORDER — ALUM & MAG HYDROXIDE-SIMETH 200-200-20 MG/5ML PO SUSP: 30.0000 mL | Freq: Four times a day (QID) | ORAL | Status: DC | PRN

## 2014-08-20 MED ORDER — HYDROCOD POLST-CHLORPHEN POLST 10-8 MG/5ML PO LQCR
5.0000 mL | Freq: Two times a day (BID) | ORAL | Status: DC | PRN
  Administered 2014-08-20 – 2014-08-21 (×2): 5 mL via ORAL
  Filled 2014-08-20 (×2): qty 5

## 2014-08-20 MED ORDER — ACETAMINOPHEN 650 MG RE SUPP: 650.0000 mg | Freq: Four times a day (QID) | RECTAL | Status: DC | PRN

## 2014-08-20 MED ORDER — ONDANSETRON HCL 4 MG PO TABS: 4.0000 mg | ORAL_TABLET | Freq: Four times a day (QID) | ORAL | Status: DC | PRN

## 2014-08-20 MED ORDER — CEFTRIAXONE SODIUM IN DEXTROSE 20 MG/ML IV SOLN
1.0000 g | INTRAVENOUS | Status: DC
  Administered 2014-08-20 – 2014-08-21 (×2): 1 g via INTRAVENOUS
  Filled 2014-08-20 (×3): qty 50

## 2014-08-20 MED ORDER — ALBUTEROL SULFATE (2.5 MG/3ML) 0.083% IN NEBU
2.5000 mg | INHALATION_SOLUTION | Freq: Four times a day (QID) | RESPIRATORY_TRACT | Status: DC
Start: 2014-08-20 — End: 2014-08-21
  Administered 2014-08-20: 2.5 mg via RESPIRATORY_TRACT
  Filled 2014-08-20: qty 3

## 2014-08-20 MED ORDER — DOCUSATE SODIUM 100 MG PO CAPS
100.0000 mg | ORAL_CAPSULE | Freq: Two times a day (BID) | ORAL | Status: DC
  Administered 2014-08-21 – 2014-08-22 (×3): 100 mg via ORAL
  Filled 2014-08-20 (×8): qty 1

## 2014-08-20 NOTE — Progress Notes (Signed)
admit  To  Room 6e  12 From  Home  Via admission  Office  Orientation  To  Room  Call bell  Safety plan Mental  Status  Alert and oriented  X 4 Living arrangements  Home with family

## 2014-08-20 NOTE — H&P (Signed)
Referring Physician:  Apolinar JunesBeenta Hoffmeister is an 33 y.o. female.                       Chief Complaint: Increasing cough.  HPI: 33 years old female with known right lung pneumonia has increasing cough and shortness of breath. She finished 5 day course of oral azithromycin. No nausea, vomiting or fever.  Past Medical History  Diagnosis Date  . No pertinent past medical history   . Status post repeat low transverse cesarean section 03/23/2011      Past Surgical History  Procedure Laterality Date  . No past surgeries    . Cesarean section  04/05/2009  . Cesarean section  03/20/2011    Procedure: CESAREAN SECTION;  Surgeon: Kathreen CosierBernard A Marshall, MD;  Location: [facility];  Service: Gynecology;  Laterality: N/A;  cord ph 7.18, fundal massage by A. Green RN    No family history on file. Social History:  reports that she does not drink alcohol or use illicit drugs. Her tobacco history is not on file.  Allergies:  Allergies  Allergen Reactions  . Chlorhexidine Gluconate Rash    Medications Prior to Admission  Medication Sig Dispense Refill  . iron polysaccharides (NIFEREX) 150 MG capsule Take 150 mg by mouth daily.    . Multiple Vitamins-Minerals (MULTIVITAMIN WITH MINERALS) tablet Take 1 tablet by mouth daily.    Marland Kitchen. PROAIR HFA 108 (90 BASE) MCG/ACT inhaler Inhale 1 puff into the lungs as needed for wheezing or shortness of breath.   0    No results found for this or any previous visit (from the past 48 hour(s)). No results found.  Review Of Systems No glasses post LASIK surgery, No weight gain, No dentures, No COPD, No angina, No leg edema, No hypertension, No DM, II, No CHF, No CVA, No seizures, No kidney stone, No arthritis.  unknown if currently breastfeeding.  VS-pending  Physical Exam:  General: Averagely built and nourished in mild respiratory distress. HEENT: [place]/AT, Intel CorporationBrown eyes, PERL, EOMI.  Neck: No JVD, no bruit, no thyromegaly.  Lungs: Bilateral basal crackles and rhonchi,  right more than left.  Heart: Normal S1 and S2. Grade II/VI systolic murmur LSB  Abdomen: Soft and non-tender.  Ext: No edema, cyanosis or clubbing.  CNS: Cranial nerves grossly intact. Moves all 4 extremities. Right handed. Skin: Warm and dry.  Assessment/Plan Right lung pneumonia  Admit/IV antibiotic/Albuterol breathing treatments.  Ricki RodriguezKADAKIA,Janette Harvie S, MD  08/20/2014, 6:33 PM

## 2014-08-21 LAB — BASIC METABOLIC PANEL
ANION GAP: 13 (ref 5–15)
BUN: 8 mg/dL (ref 6–23)
CHLORIDE: 104 meq/L (ref 96–112)
CO2: 23 mEq/L (ref 19–32)
CREATININE: 0.58 mg/dL (ref 0.50–1.10)
Calcium: 8.9 mg/dL (ref 8.4–10.5)
GFR calc Af Amer: >90 mL/min (ref >90)
GFR calc non Af Amer: >90 mL/min (ref >90)
Glucose, Bld: 95 mg/dL (ref 70–99)
Potassium: 4.2 mEq/L (ref 3.7–5.3)
Sodium: 140 mEq/L (ref 137–147)

## 2014-08-21 LAB — INFLUENZA PANEL BY PCR (TYPE A & B)
H1N1FLUPCR: NOT DETECTED
Influenza A By PCR: NEGATIVE
Influenza B By PCR: NEGATIVE

## 2014-08-21 MED ORDER — ALBUTEROL SULFATE (2.5 MG/3ML) 0.083% IN NEBU: 2.5000 mg | INHALATION_SOLUTION | Freq: Four times a day (QID) | RESPIRATORY_TRACT | Status: DC | PRN

## 2014-08-21 MED ORDER — ALBUTEROL SULFATE (2.5 MG/3ML) 0.083% IN NEBU
2.5000 mg | INHALATION_SOLUTION | Freq: Four times a day (QID) | RESPIRATORY_TRACT | Status: DC
  Administered 2014-08-21 – 2014-08-24 (×8): 2.5 mg via RESPIRATORY_TRACT
  Filled 2014-08-21 (×9): qty 3

## 2014-08-21 NOTE — Progress Notes (Signed)
Utilization review completed. Tamicka Shimon, RN, BSN. 

## 2014-08-21 NOTE — Progress Notes (Signed)
Ref: Brandun Pinn S, MD   Subjective:  Breathing and cough 40-50 % improved per patient. Afebrile.  Objective:  Vital Signs in the last 24 hours: Temp:  [97.6 F (36.4 C)-98.4 F (36.9 C)] 97.6 F (36.4 C) (12/17 0953) Pulse Rate:  [72-90] 87 (12/17 0953) Cardiac Rhythm:  [-]  Resp:  [16-18] 16 (12/17 0953) BP: (88-104)/(48-80) 101/54 mmHg (12/17 0953) SpO2:  [98 %-100 %] 100 % (12/17 0953)  Physical Exam: BP Readings from Last 1 Encounters:  08/21/14 101/54     Wt Readings from Last 1 Encounters:  03/19/11 68.947 kg (152 lb)    Weight change:   HEENT: [place]/AT, Eyes-Brown, PERL, EOMI, Conjunctiva-Pink, Sclera-Non-icteric Neck: No JVD, No bruit, Trachea midline. Lungs:  Clearing, Bilateral. Cardiac:  Regular rhythm, normal S1 and S2, no S3.  Abdomen:  Soft, non-tender. Extremities:  No edema present. No cyanosis. No clubbing. CNS: AxOx3, Cranial nerves grossly intact, moves all 4 extremities. Right handed. Skin: Warm and dry.   Intake/Output from previous day: 12/16 0701 - 12/17 0700 In: 220 [P.O.:220] Out: -     Lab Results: BMET    Component Value Date/Time   NA 140 08/21/2014 0425   NA 139 08/20/2014 1903   NA 136 04/05/2009 0844   K 4.2 08/21/2014 0425   K 4.1 08/20/2014 1903   K 4.3 04/05/2009 0844   CL 104 08/21/2014 0425   CL 101 08/20/2014 1903   CL 105 04/05/2009 0844   CO2 23 08/21/2014 0425   CO2 25 08/20/2014 1903   CO2 21 04/05/2009 0844   GLUCOSE 95 08/21/2014 0425   GLUCOSE 92 08/20/2014 1903   GLUCOSE 80 04/05/2009 0844   BUN 8 08/21/2014 0425   BUN 9 08/20/2014 1903   BUN 6 04/05/2009 0844   CREATININE 0.58 08/21/2014 0425   CREATININE 0.54 08/20/2014 1903   CREATININE 0.57 04/05/2009 0844   CALCIUM 8.9 08/21/2014 0425   CALCIUM 9.4 08/20/2014 1903   CALCIUM 8.8 04/05/2009 0844   GFRNONAA >90 08/21/2014 0425   GFRNONAA >90 08/20/2014 1903   GFRNONAA >60 04/05/2009 0844   GFRAA >90 08/21/2014 0425   GFRAA >90 08/20/2014 1903   GFRAA  04/05/2009 0844    >60        The eGFR has been calculated using the MDRD equation. This calculation has not been validated in all clinical situations. eGFR's persistently <60 mL/min signify possible Chronic Kidney Disease.   CBC    Component Value Date/Time   WBC 11.1* 08/20/2014 1903   RBC 4.37 08/20/2014 1903   HGB 11.8* 08/20/2014 1903   HCT 36.6 08/20/2014 1903   PLT 271 08/20/2014 1903   MCV 83.8 08/20/2014 1903   MCH 27.0 08/20/2014 1903   MCHC 32.2 08/20/2014 1903   RDW 13.7 08/20/2014 1903   LYMPHSABS 3.4 08/20/2014 1903   MONOABS 0.4 08/20/2014 1903   EOSABS 0.1 08/20/2014 1903   BASOSABS 0.0 08/20/2014 1903   HEPATIC Function Panel No results for input(s): PROT in the last 8760 hours.  Invalid input(s):  ALBUMIN,  AST,  ALT,  ALKPHOS,  BILIDIR,  IBILI HEMOGLOBIN A1C No components found for: HGA1C,  MPG CARDIAC ENZYMES No results found for: CKTOTAL, CKMB, CKMBINDEX, TROPONINI BNP No results for input(s): PROBNP in the last 8760 hours. TSH No results for input(s): TSH in the last 8760 hours. CHOLESTEROL No results for input(s): CHOL in the last 8760 hours.  Scheduled Meds: . cefTRIAXone (ROCEPHIN)  IV  1 g Intravenous Q24H  .  docusate sodium  100 mg Oral BID  . guaiFENesin  600 mg Oral BID  . heparin  5,000 Units Subcutaneous 3 times per day   Continuous Infusions: . sodium chloride Stopped (08/21/14 0911)   PRN Meds:.acetaminophen **OR** acetaminophen, albuterol, alum & mag hydroxide-simeth, chlorpheniramine-HYDROcodone, ondansetron **OR** ondansetron (ZOFRAN) IV  Assessment/Plan: Right lung pneumonia  Continue current treatment.     LOS: 1 day    Dixie Dials  MD  08/21/2014, 10:46 AM

## 2014-08-22 ENCOUNTER — Inpatient Hospital Stay (HOSPITAL_COMMUNITY): Payer: BC Managed Care – PPO

## 2014-08-22 MED ORDER — ALBUTEROL SULFATE (2.5 MG/3ML) 0.083% IN NEBU: 2.5000 mg | INHALATION_SOLUTION | RESPIRATORY_TRACT | Status: DC | PRN

## 2014-08-22 MED ORDER — PNEUMOCOCCAL VAC POLYVALENT 25 MCG/0.5ML IJ INJ
0.5000 mL | INJECTION | INTRAMUSCULAR | Status: DC
  Filled 2014-08-22: qty 0.5

## 2014-08-22 MED ORDER — SODIUM CHLORIDE 0.9 % IV BOLUS (SEPSIS)
500.0000 mL | Freq: Once | INTRAVENOUS | Status: AC
  Administered 2014-08-22: 500 mL via INTRAVENOUS

## 2014-08-22 MED ORDER — DEXTROSE-NACL 5-0.45 % IV SOLN
INTRAVENOUS | Status: DC
  Administered 2014-08-22 – 2014-08-24 (×3): via INTRAVENOUS

## 2014-08-22 MED ORDER — CEFTRIAXONE SODIUM IN DEXTROSE 20 MG/ML IV SOLN
1.0000 g | INTRAVENOUS | Status: DC
  Administered 2014-08-22: 1 g via INTRAVENOUS
  Filled 2014-08-22 (×2): qty 50

## 2014-08-22 MED ORDER — SODIUM CHLORIDE 0.9 % IV SOLN: INTRAVENOUS | Status: DC

## 2014-08-22 NOTE — Progress Notes (Signed)
Nausea spell passed,refusing med also no cough syrup ,but with chills and temp 100.States headache getting better but at home uses  Ibuprofen. Denies need for anything at the moment.

## 2014-08-22 NOTE — Progress Notes (Signed)
Vomited again  Clear yellow fluid, with c/o gas. Will attempt antacid per prn order.

## 2014-08-22 NOTE — Progress Notes (Signed)
Clarified with Dr. Algie CofferKadakia regarding order for IVF,MD wants both IV,NS at 50cc/hr and D5 0,45 NS at 50cc/hr simultaneously. Ryaan Vanwagoner, Drinda Buttsharito Joselita, RCharity fundraiser

## 2014-08-22 NOTE — Progress Notes (Signed)
Ref: KADAKIA,AJAY S, MD   Subjective:  She has fever developed this afternoon. She has nausea, vomiting followed by BM. Also has headache increased with movement and cough. Some neck discomfort also. Tachycardia with low blood pressure.  Objective:  Vital Signs in the last 24 hours: Temp:  [98.3 F (36.8 C)-102.1 F (38.9 C)] 101.7 F (38.7 C) (12/18 1932) Pulse Rate:  [73-157] 137 (12/18 1932) Cardiac Rhythm:  [-]  Resp:  [18-20] 18 (12/18 1722) BP: (92-122)/(48-71) 104/53 mmHg (12/18 1932) SpO2:  [98 %-100 %] 100 % (12/18 1932) Weight:  [60.3 kg (132 lb 15 oz)] 60.3 kg (132 lb 15 oz) (12/18 1932)  Physical Exam: BP Readings from Last 1 Encounters:  08/22/14 104/53     Wt Readings from Last 1 Encounters:  08/22/14 60.3 kg (132 lb 15 oz)    Weight change:   HEENT: [place]/AT, Eyes-Brown, PERL, EOMI, Conjunctiva-Pink, Sclera-Non-icteric Neck: No JVD, No bruit, Trachea midline. Neck tenderness posteriorly increased with flexion. Lungs:  Clearing, Bilateral. Cardiac:  Regular rhythm, normal S1 and S2, no S3.  Abdomen:  Soft, non-tender. Extremities:  No edema present. No cyanosis. No clubbing. CNS: AxOx3, Cranial nerves grossly intact, moves all 4 extremities. Right handed. Skin: Warm and dry.   Intake/Output from previous day: 12/17 0701 - 12/18 0700 In: 1284.2 [P.O.:240; I.V.:944.2; IV Piggyback:100] Out: 1400 [Urine:1400]    Lab Results: BMET    Component Value Date/Time   NA 140 08/21/2014 0425   NA 139 08/20/2014 1903   NA 136 04/05/2009 0844   K 4.2 08/21/2014 0425   K 4.1 08/20/2014 1903   K 4.3 04/05/2009 0844   CL 104 08/21/2014 0425   CL 101 08/20/2014 1903   CL 105 04/05/2009 0844   CO2 23 08/21/2014 0425   CO2 25 08/20/2014 1903   CO2 21 04/05/2009 0844   GLUCOSE 95 08/21/2014 0425   GLUCOSE 92 08/20/2014 1903   GLUCOSE 80 04/05/2009 0844   BUN 8 08/21/2014 0425   BUN 9 08/20/2014 1903   BUN 6 04/05/2009 0844   CREATININE 0.58 08/21/2014 0425   CREATININE 0.54 08/20/2014 1903   CREATININE 0.57 04/05/2009 0844   CALCIUM 8.9 08/21/2014 0425   CALCIUM 9.4 08/20/2014 1903   CALCIUM 8.8 04/05/2009 0844   GFRNONAA >90 08/21/2014 0425   GFRNONAA >90 08/20/2014 1903   GFRNONAA >60 04/05/2009 0844   GFRAA >90 08/21/2014 0425   GFRAA >90 08/20/2014 1903   GFRAA  04/05/2009 0844    >60        The eGFR has been calculated using the MDRD equation. This calculation has not been validated in all clinical situations. eGFR's persistently <60 mL/min signify possible Chronic Kidney Disease.   CBC    Component Value Date/Time   WBC 11.1* 08/20/2014 1903   RBC 4.37 08/20/2014 1903   HGB 11.8* 08/20/2014 1903   HCT 36.6 08/20/2014 1903   PLT 271 08/20/2014 1903   MCV 83.8 08/20/2014 1903   MCH 27.0 08/20/2014 1903   MCHC 32.2 08/20/2014 1903   RDW 13.7 08/20/2014 1903   LYMPHSABS 3.4 08/20/2014 1903   MONOABS 0.4 08/20/2014 1903   EOSABS 0.1 08/20/2014 1903   BASOSABS 0.0 08/20/2014 1903   HEPATIC Function Panel No results for input(s): PROT in the last 8760 hours.  Invalid input(s):  ALBUMIN,  AST,  ALT,  ALKPHOS,  BILIDIR,  IBILI HEMOGLOBIN A1C No components found for: HGA1C,  MPG CARDIAC ENZYMES No results found for: CKTOTAL, CKMB, CKMBINDEX, TROPONINI   BNP No results for input(s): PROBNP in the last 8760 hours. TSH No results for input(s): TSH in the last 8760 hours. CHOLESTEROL No results for input(s): CHOL in the last 8760 hours.  Scheduled Meds: . albuterol  2.5 mg Nebulization Q6H  . cefTRIAXone (ROCEPHIN)  IV  1 g Intravenous Q24H  . docusate sodium  100 mg Oral BID  . guaiFENesin  600 mg Oral BID  . heparin  5,000 Units Subcutaneous 3 times per day  . [START ON 08/23/2014] pneumococcal 23 valent vaccine  0.5 mL Intramuscular Tomorrow-1000  . sodium chloride  500 mL Intravenous Once   Continuous Infusions: . sodium chloride 20 mL/hr at 08/22/14 1228  . sodium chloride     PRN Meds:.acetaminophen **OR**  acetaminophen, albuterol, alum & mag hydroxide-simeth, chlorpheniramine-HYDROcodone, ondansetron **OR** ondansetron (ZOFRAN) IV  Assessment/Plan: Pneumonia Viral syndrome/Meningitis. Early dehydration R/O bowel obstruction  CT head/Repeat CXR/Abdomen x-ray. Infectious disease consult-Dr.Comer notified. IV fluid bolus     LOS: 2 days    Ajay Kadakia  MD  08/22/2014, 7:43 PM      

## 2014-08-22 NOTE — Progress Notes (Signed)
No c/o nausea but vomited 300cc with undigested food . Continues with  Dry hacky cough,but encouraging patient  To take cough syrup.

## 2014-08-23 DIAGNOSIS — R509 Fever, unspecified: Secondary | ICD-10-CM

## 2014-08-23 DIAGNOSIS — J189 Pneumonia, unspecified organism: Secondary | ICD-10-CM

## 2014-08-23 DIAGNOSIS — B349 Viral infection, unspecified: Secondary | ICD-10-CM

## 2014-08-23 LAB — CBC WITH DIFFERENTIAL/PLATELET
BASOS PCT: 0 % (ref 0–1)
Basophils Absolute: 0 10*3/uL (ref 0.0–0.1)
EOS PCT: 1 % (ref 0–5)
Eosinophils Absolute: 0.1 10*3/uL (ref 0.0–0.7)
HEMATOCRIT: 32.2 % — AB (ref 36.0–46.0)
Hemoglobin: 10.1 g/dL — ABNORMAL LOW (ref 12.0–15.0)
Lymphocytes Relative: 7 % — ABNORMAL LOW (ref 12–46)
Lymphs Abs: 1.1 10*3/uL (ref 0.7–4.0)
MCH: 25.9 pg — AB (ref 26.0–34.0)
MCHC: 31.4 g/dL (ref 30.0–36.0)
MCV: 82.6 fL (ref 78.0–100.0)
MONO ABS: 0.4 10*3/uL (ref 0.1–1.0)
Monocytes Relative: 3 % (ref 3–12)
NEUTROS ABS: 13.4 10*3/uL — AB (ref 1.7–7.7)
Neutrophils Relative %: 89 % — ABNORMAL HIGH (ref 43–77)
Platelets: 194 10*3/uL (ref 150–400)
RBC: 3.9 MIL/uL (ref 3.87–5.11)
RDW: 14.4 % (ref 11.5–15.5)
WBC: 14.9 10*3/uL — ABNORMAL HIGH (ref 4.0–10.5)

## 2014-08-23 LAB — BASIC METABOLIC PANEL
ANION GAP: 12 (ref 5–15)
BUN: 8 mg/dL (ref 6–23)
CALCIUM: 8.3 mg/dL — AB (ref 8.4–10.5)
CO2: 23 mEq/L (ref 19–32)
CREATININE: 0.51 mg/dL (ref 0.50–1.10)
Chloride: 103 mEq/L (ref 96–112)
GFR calc non Af Amer: >90 mL/min (ref >90)
Glucose, Bld: 92 mg/dL (ref 70–99)
Potassium: 3.8 mEq/L (ref 3.7–5.3)
Sodium: 138 mEq/L (ref 137–147)

## 2014-08-23 NOTE — Consult Note (Signed)
    [facility]     Reason for Consult: fever    Referring Physician: Dr. Algie CofferKadakia  Principal Problem:   Pneumonia involving right lung Active Problems:   Pneumonia   . albuterol  2.5 mg Nebulization Q6H  . cefTRIAXone (ROCEPHIN)  IV  1 g Intravenous Q24H  . docusate sodium  100 mg Oral BID  . guaiFENesin  600 mg Oral BID    Recommendations: Continue supportive care D/c antibiotics   Assessment: She has symptoms of cough, GI upset, sick contact, c/w viral syndrome.  Did have some possible pneumonia on CXR but repeat is negative.     Thanks for the consult  Antibiotics: Ceftriaxone, did finish Z-pack  HPI: Penny Bishop is a 33 y.o. female with no significant pmh, started feeling sick with cough, fever about 7-8 days ago.  Diagnosed with CAP due to symptoms and CXR findings and started on macrolide.  Continued to feel bad and admitted 12/16 and started on ceftriaxone.  Is slowly improving but yesterday seemed to feel worse with headache, fever to 102.  CXR, Abd xray and CT scan of head done and normal, including resolution of small area of possible RLL infiltrate.  Some neck pain but is occipital and not actually neck.  Poor po, + vomiting.     Review of Systems: A comprehensive review of systems was negative.  Past Medical History  Diagnosis Date  . Status post repeat low transverse cesarean section 03/23/2011  . Pneumonia 08/20/2014  . Iron deficiency anemia   . Hepatitis B ~ 2001    History  Substance Use Topics  . Smoking status: Never Smoker   . Smokeless tobacco: Never Used  . Alcohol Use: No    History reviewed. No pertinent family history. Allergies  Allergen Reactions  . Chlorhexidine Gluconate Rash    OBJECTIVE: Blood pressure 95/53, pulse 86, temperature 98.1 F (36.7 C), temperature source Oral, resp. rate 19, height 5\' 2"  (1.575 m), weight 132 lb 15 oz (60.3 kg), last menstrual period 08/06/2014, SpO2 100 %, unknown if  currently breastfeeding. General: awake, alert, nad Skin: no rashes Lungs: CTA B Cor: RRR Abdomen: soft, nt, nd Ext: no edema HEENT: no meningismus  Microbiology: No results found for this or any previous visit (from the past 240 hour(s)).  Staci RighterOMER, Lenita Peregrina, MD [facility] [email or web address] [phone] pager  [phone] cell 08/23/2014, 11:52 AM

## 2014-08-23 NOTE — Progress Notes (Signed)
Ref: Ricki RodriguezKADAKIA,Jaelynn Currier S, MD   Subjective:  Feeling better. Headache almost gone. Weakness is improving. Afebrile. Chest x-ray resolving pneumonia and abdomen x-ray and CT head are unremarkable.  Objective:  Vital Signs in the last 24 hours: Temp:  [97 F (36.1 C)-102.1 F (38.9 C)] 98.1 F (36.7 C) (12/19 1023) Pulse Rate:  [86-157] 86 (12/19 1023) Cardiac Rhythm:  [-]  Resp:  [18-20] 19 (12/19 1023) BP: (95-122)/(47-71) 95/53 mmHg (12/19 1023) SpO2:  [99 %-100 %] 100 % (12/19 1023) Weight:  [60.3 kg (132 lb 15 oz)] 60.3 kg (132 lb 15 oz) (12/18 2057)  Physical Exam: BP Readings from Last 1 Encounters:  08/23/14 95/53     Wt Readings from Last 1 Encounters:  08/22/14 60.3 kg (132 lb 15 oz)    Weight change: 0.607 kg (1 lb 5.4 oz)  HEENT: [place]/AT, Eyes-Brown, PERL, EOMI, Conjunctiva-Pink, Sclera-Non-icteric Neck: No JVD, No bruit, Trachea midline. Lungs:  Clear, Bilateral. Cardiac:  Regular rhythm, normal S1 and S2, no S3.  Abdomen:  Soft, non-tender. Extremities:  No edema present. No cyanosis. No clubbing. CNS: AxOx3, Cranial nerves grossly intact, moves all 4 extremities. Right handed. Skin: Warm and dry.   Intake/Output from previous day: 12/18 0701 - 12/19 0700 In: 1325.8 [P.O.:540; I.V.:785.8] Out: 700 [Urine:700]    Lab Results: BMET    Component Value Date/Time   NA 138 08/23/2014 0449   NA 140 08/21/2014 0425   NA 139 08/20/2014 1903   K 3.8 08/23/2014 0449   K 4.2 08/21/2014 0425   K 4.1 08/20/2014 1903   CL 103 08/23/2014 0449   CL 104 08/21/2014 0425   CL 101 08/20/2014 1903   CO2 23 08/23/2014 0449   CO2 23 08/21/2014 0425   CO2 25 08/20/2014 1903   GLUCOSE 92 08/23/2014 0449   GLUCOSE 95 08/21/2014 0425   GLUCOSE 92 08/20/2014 1903   BUN 8 08/23/2014 0449   BUN 8 08/21/2014 0425   BUN 9 08/20/2014 1903   CREATININE 0.51 08/23/2014 0449   CREATININE 0.58 08/21/2014 0425   CREATININE 0.54 08/20/2014 1903   CALCIUM 8.3* 08/23/2014 0449   CALCIUM 8.9 08/21/2014 0425   CALCIUM 9.4 08/20/2014 1903   GFRNONAA >90 08/23/2014 0449   GFRNONAA >90 08/21/2014 0425   GFRNONAA >90 08/20/2014 1903   GFRAA >90 08/23/2014 0449   GFRAA >90 08/21/2014 0425   GFRAA >90 08/20/2014 1903   CBC    Component Value Date/Time   WBC 14.9* 08/23/2014 0449   RBC 3.90 08/23/2014 0449   HGB 10.1* 08/23/2014 0449   HCT 32.2* 08/23/2014 0449   PLT 194 08/23/2014 0449   MCV 82.6 08/23/2014 0449   MCH 25.9* 08/23/2014 0449   MCHC 31.4 08/23/2014 0449   RDW 14.4 08/23/2014 0449   LYMPHSABS 1.1 08/23/2014 0449   MONOABS 0.4 08/23/2014 0449   EOSABS 0.1 08/23/2014 0449   BASOSABS 0.0 08/23/2014 0449   HEPATIC Function Panel No results for input(s): PROT in the last 8760 hours.  Invalid input(s):  ALBUMIN,  AST,  ALT,  ALKPHOS,  BILIDIR,  IBILI HEMOGLOBIN A1C No components found for: HGA1C,  MPG CARDIAC ENZYMES No results found for: CKTOTAL, CKMB, CKMBINDEX, TROPONINI BNP No results for input(s): PROBNP in the last 8760 hours. TSH No results for input(s): TSH in the last 8760 hours. CHOLESTEROL No results for input(s): CHOL in the last 8760 hours.  Scheduled Meds: . albuterol  2.5 mg Nebulization Q6H  . cefTRIAXone (ROCEPHIN)  IV  1  g Intravenous Q24H  . docusate sodium  100 mg Oral BID  . guaiFENesin  600 mg Oral BID   Continuous Infusions: . sodium chloride 50 mL/hr at 08/22/14 2349  . dextrose 5 % and 0.45% NaCl 50 mL/hr at 08/22/14 2212   PRN Meds:.acetaminophen **OR** acetaminophen, albuterol, alum & mag hydroxide-simeth, chlorpheniramine-HYDROcodone, ondansetron **OR** ondansetron (ZOFRAN) IV  Assessment/Plan: Pneumonia-resolving Viral syndrome Early dehydration Weakness   Continue medications and IV fluid. Increase diet as tolerated.   LOS: 3 days    Orpah CobbAjay Sharmel Ballantine  MD  08/23/2014, 11:01 AM

## 2014-08-24 LAB — IRON AND TIBC
Iron: 38 ug/dL — ABNORMAL LOW (ref 42–135)
Saturation Ratios: 11 % — ABNORMAL LOW (ref 20–55)
TIBC: 359 ug/dL (ref 250–470)
UIBC: 321 ug/dL (ref 125–400)

## 2014-08-24 LAB — FERRITIN: Ferritin: 42 ng/mL (ref 10–291)

## 2014-08-24 NOTE — Progress Notes (Signed)
Patient discharged home with husband. Discharge instructions given. Pt. Verbalized signs and symptoms of worsening condition and when to call the doctor. Follow up appointments discussed.   Ok AnisSanders,Kaleeah Gingerich A, RN 7:33 PM 08/24/2014

## 2014-08-24 NOTE — Discharge Summary (Signed)
Physician Discharge Summary  Patient ID: Penny Bishop MRN: [id number] DOB/AGE: Aug 16, 1981 33 y.o.  Admit date: 08/20/2014 Discharge date: 08/24/2014  Admission Diagnoses: Right lung pneumonia  Discharge Diagnoses:  Principal Problem: * Pneumonia involving right lung * Viral syndrome Dehydration Chronic iron deficiency anemia  Discharged Condition: good  Hospital Course: 33 years old female with known right lung pneumonia has increasing cough and shortness of breath. She finished 5 day course of oral azithromycin. She developed headache, nausea, vomiting and fever. Infectious disease consult was obtained. Repeat chest x-ray showed near resolution of pneumonia. CT of head was unremarkable.  Consults: ID  Significant Diagnostic Studies: labs: Elevated WBC and low Hgb/HCT. Normal BMET. Negative H1N1 flu by PCR, Negative influenza A and B by PCR. Blood group-B positive.  Treatments: IV hydration, antibiotics: ceftriaxone, respiratory therapy: Albuterol nebulizer treatment and Tussionex.  Discharge Exam: Blood pressure 103/57, pulse 91, temperature 98.4 F (36.9 C), temperature source Oral, resp. rate 18, height 5\' 2"  (1.575 m), weight 60.3 kg (132 lb 15 oz), last menstrual period 08/06/2014, SpO2 100 %, unknown if currently breastfeeding. HEENT: [place]/AT, Eyes-Brown, PERL, EOMI, Conjunctiva-Pink, Sclera-Non-icteric Neck: No JVD, No bruit, Trachea midline. Lungs: Clear, Bilateral. Cardiac: Regular rhythm, normal S1 and S2, no S3.  Abdomen: Soft, non-tender. Extremities: No edema present. No cyanosis. No clubbing. CNS: AxOx3, Cranial nerves grossly intact, moves all 4 extremities. Right handed. Skin: Warm and dry.  Disposition: 01-Home or Self Care     Medication List    TAKE these medications        iron polysaccharides 150 MG capsule  Commonly known as:  NIFEREX  Take 150 mg by mouth daily.     multivitamin with minerals tablet  Take 1 tablet by mouth daily.     PROAIR HFA 108 (90 BASE) MCG/ACT inhaler  Generic drug:  albuterol  Inhale 1 puff into the lungs as needed for wheezing or shortness of breath.           Follow-up Information    [facility] Conn[facility], MD. Schedule an appointment as soon as possible for a visit in 1 month.   Specialty:  Cardiology   Contact information:   [address] [place] [id number] [phone]       Signed: Ricki RodriguezKADAKIA,Errin Whitelaw S 08/24/2014, 6:21 PM

## 2014-12-29 ENCOUNTER — Ambulatory Visit (INDEPENDENT_AMBULATORY_CARE_PROVIDER_SITE_OTHER): Payer: 59 | Admitting: Physician Assistant

## 2014-12-29 VITALS — BP 96/50 | HR 75 | Temp 97.6°F | Resp 16 | Ht 61.5 in | Wt 135.0 lb

## 2014-12-29 DIAGNOSIS — D509 Iron deficiency anemia, unspecified: Secondary | ICD-10-CM | POA: Diagnosis not present

## 2014-12-29 DIAGNOSIS — L659 Nonscarring hair loss, unspecified: Secondary | ICD-10-CM | POA: Diagnosis not present

## 2014-12-29 DIAGNOSIS — Z13220 Encounter for screening for lipoid disorders: Secondary | ICD-10-CM | POA: Diagnosis not present

## 2014-12-29 DIAGNOSIS — L709 Acne, unspecified: Secondary | ICD-10-CM

## 2014-12-29 DIAGNOSIS — Z0000 Encounter for general adult medical examination without abnormal findings: Secondary | ICD-10-CM

## 2014-12-29 LAB — COMPREHENSIVE METABOLIC PANEL
ALBUMIN: 4.2 g/dL (ref 3.5–5.2)
ALT: 22 U/L (ref 0–35)
AST: 19 U/L (ref 0–37)
Alkaline Phosphatase: 87 U/L (ref 39–117)
BUN: 9 mg/dL (ref 6–23)
CO2: 24 mEq/L (ref 19–32)
Calcium: 9.3 mg/dL (ref 8.4–10.5)
Chloride: 103 mEq/L (ref 96–112)
Creat: 0.61 mg/dL (ref 0.50–1.10)
Glucose, Bld: 92 mg/dL (ref 70–99)
POTASSIUM: 4.2 meq/L (ref 3.5–5.3)
SODIUM: 136 meq/L (ref 135–145)
Total Bilirubin: 0.4 mg/dL (ref 0.2–1.2)
Total Protein: 7.7 g/dL (ref 6.0–8.3)

## 2014-12-29 LAB — CBC WITH DIFFERENTIAL/PLATELET
BASOS PCT: 0 % (ref 0–1)
Basophils Absolute: 0 10*3/uL (ref 0.0–0.1)
EOS ABS: 0.1 10*3/uL (ref 0.0–0.7)
EOS PCT: 1 % (ref 0–5)
HEMATOCRIT: 36.6 % (ref 36.0–46.0)
Hemoglobin: 11.9 g/dL — ABNORMAL LOW (ref 12.0–15.0)
LYMPHS ABS: 2.2 10*3/uL (ref 0.7–4.0)
Lymphocytes Relative: 29 % (ref 12–46)
MCH: 26.2 pg (ref 26.0–34.0)
MCHC: 32.5 g/dL (ref 30.0–36.0)
MCV: 80.6 fL (ref 78.0–100.0)
MONO ABS: 0.4 10*3/uL (ref 0.1–1.0)
MONOS PCT: 5 % (ref 3–12)
MPV: 10.4 fL (ref 8.6–12.4)
NEUTROS PCT: 65 % (ref 43–77)
Neutro Abs: 4.9 10*3/uL (ref 1.7–7.7)
Platelets: 231 10*3/uL (ref 150–400)
RBC: 4.54 MIL/uL (ref 3.87–5.11)
RDW: 14.3 % (ref 11.5–15.5)
WBC: 7.5 10*3/uL (ref 4.0–10.5)

## 2014-12-29 LAB — TSH: TSH: 2.963 u[IU]/mL (ref 0.350–4.500)

## 2014-12-29 LAB — LIPID PANEL
CHOL/HDL RATIO: 3.5 ratio
CHOLESTEROL: 133 mg/dL (ref 0–200)
HDL: 38 mg/dL — ABNORMAL LOW (ref >=46)
LDL Cholesterol: 68 mg/dL (ref 0–99)
Triglycerides: 136 mg/dL (ref <150)
VLDL: 27 mg/dL (ref 0–40)

## 2014-12-29 LAB — IRON AND TIBC
%SAT: 9 % — ABNORMAL LOW (ref 20–55)
Iron: 37 ug/dL — ABNORMAL LOW (ref 42–145)
TIBC: 401 ug/dL (ref 250–470)
UIBC: 364 ug/dL (ref 125–400)

## 2014-12-29 LAB — FERRITIN: Ferritin: 24 ng/mL (ref 10–291)

## 2014-12-29 NOTE — Patient Instructions (Signed)
Continue iron supplement. Try to exercise 3-4 times a week. Return for pap smear/breast exam. I will call with your lab results. You will get a phone call to make appointment with dermatology - talk to them about your acne as well.

## 2014-12-29 NOTE — Progress Notes (Signed)
Subjective:    Patient ID: Penny Bishop, female    DOB: 09-22-1980, 34 y.o.   MRN: [id number]  HPI  This is a 34 year old female who is presenting for CPE.   Pt is complaining of hair loss x 8-9 months. Has been seeing dermatology for alopecia. She reports her insurance needs a primary care referral to dermatology. They have not checked thyroid. She reports she has lost 80% of her hair. Hair loss started at the top and is now throughout. She reports they checked her blood 8-9 months ago and she was told she was anemic and her protein was low. She takes a protein and iron supplement daily.  Pt complaining of worsening acne on her jaw line and neck for the past few months. She never mentioned this to her dermatologist.  Reports she had pneumonia 4 months ago and was hospitalized for 5 days. She was treated outpatient for 1 week without help and eventually was admitted. She got better and has not had problems since.  Married with 2 kids, son is 5, daughter is 3. She works in a lab.  LMP: LMP 12/23/14. Periods are regular and light. Contraception: copper IUD placed 4 years ago. Last pap: 2010. Wants to come back because she "did not wax".  Immunizations: Last Tdap 2010. Never gets flu shot. Dentist: Last dental cleaning 1 year. Due for cleaning. Eye: does not wear glasses or contacts. No vision change. Diet/Exercise: walks for exercise 1-2 times a week. Cooks at home, [place] food. Lots of vegetables and rice. Drinks water. Does not drink alcohol. No tobacco.  Fam hx: Mom with HTN and DM, Dad with HTN. PGF with esophageal cancer. Niece with leukemia. Paternal uncle with colon cancer at age 1. No family history of breast cancer.  Review of Systems  Constitutional: Negative for fever, chills and fatigue.  HENT: Negative.   Eyes: Negative.   Respiratory: Negative.   Cardiovascular: Negative.   Gastrointestinal: Negative.   Endocrine: Negative.   Genitourinary: Negative.   Musculoskeletal:  Negative.   Skin:       Hair loss acne  Allergic/Immunologic: Negative.   Neurological: Negative.   Hematological: Negative.   Psychiatric/Behavioral: Negative.    Patient Active Problem List   Diagnosis Date Noted  . Hair loss 12/29/2014  . Anemia, iron deficiency 12/29/2014   Prior to Admission medications   Medication Sig Start Date End Date Taking? Authorizing Provider  IRON PO Take by mouth.   Yes Historical Provider, MD  Multiple Vitamins-Minerals (MULTIVITAMIN WITH MINERALS) tablet Take 1 tablet by mouth daily.   Yes Historical Provider, MD  iron polysaccharides (NIFEREX) 150 MG capsule Take 150 mg by mouth daily.    Historical Provider, MD  PROAIR HFA 108 (90 BASE) MCG/ACT inhaler Inhale 1 puff into the lungs as needed for wheezing or shortness of breath.  08/13/14   Historical Provider, MD   Allergies  Allergen Reactions  . Chlorhexidine Gluconate Rash   Patient's social and family history were reviewed.     Objective:   Physical Exam  Constitutional: She is oriented to person, place, and time. She appears well-developed and well-nourished. No distress.  HENT:  Head: Normocephalic and atraumatic.  Right Ear: Hearing, tympanic membrane, external ear and ear canal normal.  Left Ear: Hearing, tympanic membrane, external ear and ear canal normal.  Nose: Nose normal. Right sinus exhibits no maxillary sinus tenderness and no frontal sinus tenderness. Left sinus exhibits no maxillary sinus tenderness and no frontal  sinus tenderness.  Mouth/Throat: Uvula is midline and mucous membranes are normal. Posterior oropharyngeal erythema present. No oropharyngeal exudate or posterior oropharyngeal edema.  Eyes: Conjunctivae, EOM and lids are normal. Pupils are equal, round, and reactive to light. Right eye exhibits no discharge. Left eye exhibits no discharge. No scleral icterus.  Neck: Trachea normal. Carotid bruit is not present. No thyromegaly present.  Cardiovascular: Normal rate,  regular rhythm, normal heart sounds, intact distal pulses and normal pulses.   No murmur heard. Pulmonary/Chest: Effort normal and breath sounds normal. No respiratory distress. She has no wheezes. She has no rhonchi. She has no rales.  Abdominal: Soft. Normal appearance and bowel sounds are normal. She exhibits no abdominal bruit. There is no tenderness. There is no CVA tenderness.  Musculoskeletal: Normal range of motion.  Lymphadenopathy:       Head (right side): No submental, no submandibular, no tonsillar, no preauricular, no posterior auricular and no occipital adenopathy present.       Head (left side): No submental, no submandibular, no tonsillar, no preauricular, no posterior auricular and no occipital adenopathy present.    She has no cervical adenopathy.  Neurological: She is alert and oriented to person, place, and time. She has normal strength and normal reflexes. No cranial nerve deficit or sensory deficit. Coordination and gait normal.  Skin: Skin is warm, dry and intact. No lesion and no rash noted.  Psychiatric: She has a normal mood and affect. Her speech is normal and behavior is normal. Thought content normal.   BP 96/50 mmHg  Pulse 75  Temp(Src) 97.6 F (36.4 C) (Oral)  Resp 16  Ht 5' 1.5" (1.562 m)  Wt 135 lb (61.236 kg)  BMI 25.10 kg/m2  SpO2 100%  LMP 12/23/2014  Breastfeeding? No   Visual Acuity Screening   Right eye Left eye Both eyes  Without correction: 20/20 20/25 20/15   With correction:         Assessment & Plan:  1. Annual physical exam Advised pt increase her exercise to 3-4 times a week. Continue with healthy eating. Return for pap smear and breast exam. - CBC with Differential/Platelet  2. Hair loss 3. Acne, unspecified CMP and TSH pending. Made referral to derm. Advised she talk with her dermatologist about acne treatment as well. - Comprehensive metabolic panel - TSH - Ambulatory referral to Dermatology  4. Lipid screening - Lipid  panel  5. Anemia, iron deficiency Continue daily iron supplement. Recheck ferritin, iron and TIBC - Ferritin - Iron and TIBC   Nicole V. Dyke BrackettBush, PA-C, MHS [facility]  12/29/2014

## 2015-01-01 ENCOUNTER — Telehealth: Payer: Self-pay

## 2015-01-01 NOTE — Telephone Encounter (Signed)
Pt calling about labs. Please review. Thanks  

## 2015-01-02 NOTE — Telephone Encounter (Signed)
Pt calling second time asking for lab results. Please advise

## 2015-01-03 NOTE — Telephone Encounter (Signed)
Please call pt and let her know her lab results. 1. Her anemia has improved to almost normal levels. The supplement she is taking seems to be working. She should continue taking it daily. 2. Cholesterol looks good. HDL (good cholesterol) is a little low - exercise can help this. 3. Liver and kidney function and electrolytes normal. 4. Thyroid normal - I am unsure why she is losing hair but it is not because of her thyroid. She should follow up with dermatology as planned.  If she has any questions/concerns, let me know.

## 2015-01-05 NOTE — Telephone Encounter (Signed)
Labs faxed to Derm, Dr. Sharyn LullHaverstock

## 2015-01-05 NOTE — Telephone Encounter (Signed)
Pt.notified

## 2015-01-06 ENCOUNTER — Telehealth: Payer: Self-pay

## 2015-01-07 ENCOUNTER — Telehealth: Payer: Self-pay

## 2015-01-07 NOTE — Telephone Encounter (Signed)
Called patient and left message on her machine to call us back and let us know if she wanted us to send her lab results to the dermatologist or if she wanted us receive them from them. Waiting on her to call back before I process her release request.

## 2015-01-14 NOTE — Telephone Encounter (Signed)
It looks like patient was referred to [facility], Dr. [name] with an appt on May 3. Usually all records related to referral are sent, along with referral information. Tried to call [facility] to see if all records received or if they needed us to send anything but no answer. Sent a fax instead.

## 2015-01-16 NOTE — Telephone Encounter (Signed)
Pam from [facility] left voicemail today at 2:56pm stating that they faxed the request not knowing that they already had what they needed and do not need us to send anything.

## 2015-06-08 ENCOUNTER — Ambulatory Visit (INDEPENDENT_AMBULATORY_CARE_PROVIDER_SITE_OTHER): Payer: 59 | Admitting: Family Medicine

## 2015-06-08 VITALS — BP 94/64 | HR 58 | Temp 98.4°F | Resp 16 | Ht 61.5 in | Wt 137.0 lb

## 2015-06-08 DIAGNOSIS — N644 Mastodynia: Secondary | ICD-10-CM

## 2015-06-08 DIAGNOSIS — L739 Follicular disorder, unspecified: Secondary | ICD-10-CM | POA: Diagnosis not present

## 2015-06-08 DIAGNOSIS — Z5181 Encounter for therapeutic drug level monitoring: Secondary | ICD-10-CM | POA: Diagnosis not present

## 2015-06-08 DIAGNOSIS — B351 Tinea unguium: Secondary | ICD-10-CM

## 2015-06-08 MED ORDER — TERBINAFINE HCL 250 MG PO TABS: 250.0000 mg | ORAL_TABLET | Freq: Every day | ORAL | Status: DC

## 2015-06-08 NOTE — Progress Notes (Signed)
Patient ID: Penny Bishop, female    DOB: 05-Jun-1981  Age: 34 y.o. MRN: [id number]  Chief Complaint  Patient presents with  . Follow-up    Breast pain  . Rash    Red dots on chest and arms, sometimes on back    Subjective:   34 year old lady who works at [employer], mother of 2 children. She is here with concerns about pain in her left breast is going on for the last couple weeks. No injury. She just is aware of discomfort in the left breast axillary wing. No redness or other abnormalities. She has also had some scattered rash on her upper chest wall and right upper arm. She has the normal stresses of being a mother with children and working. She is from [place] but is been a year since she has been over there. She does have a cousin who died in her 44s with breast cancer, who started out with just having breast pain. This certainly adds to the anxiety of of her having symptoms.  Current allergies, medications, problem list, past/family and social histories reviewed.  Objective:  BP 94/64 mmHg  Pulse 58  Temp(Src) 98.4 F (36.9 C) (Oral)  Resp 16  Ht 5' 1.5" (1.562 m)  Wt 137 lb (62.143 kg)  BMI 25.47 kg/m2  SpO2 98%  LMP 05/23/2015  Healthy-appearing young lady in no acute distress. Has some small follicular like rash spots on the right anterior shoulder region and above the left breast. She has some burn marks on her left upper arm. The spots are erythematous, mildly papular. No obvious drainage. The appearance of partially healed follicular lesions. She also has a toenail on her left large toe which had to be removed in [place] for fungal infection. It has grown back in a dystrophic fashion, thick and slightly curved. Underneath it has a little bit of the fungal tissue type appearance.  Assessment & Plan:   Assessment: 1. Breast pain, left   2. Onychomycosis of left great toe   3. Folliculitis   4. Medication monitoring encounter       Plan: Treat the folliculitis with a little  antibiotics ointment on them. If it persists can try hydrocortisone cream but I do not believe that that should be the primary treatment.  Will monitor the breast. Treat with anti-inflammatory medication. Plan to recheck in about 3 months, but if it still giving a lot of painful problems and symptoms after a couple of weeks of ibuprofen or Aleve will go ahead and get a mammogram which I do not feel as needed this time.  Treat the toenail with terbinafine. Need to check liver functions after 30 days.  Orders Placed This Encounter  Procedures  . Hepatic function panel    Standing Status: Future     Number of Occurrences:      Standing Expiration Date: 06/07/2016    Meds ordered this encounter  Medications  . terbinafine (LAMISIL) 250 MG tablet    Sig: Take 1 tablet (250 mg total) by mouth daily.    Dispense:  30 tablet    Refill:  0         Patient Instructions  Take over-the-counter Aleve 2 pills twice daily with breakfast and with supper for 10 days to 2 weeks for anti-inflammatory effect for breast.  Take the terbinafine 250 mg one pill daily. After one month please return for some blood testing of your liver enzymes. Assuming they are normal we will continue the medicine for  another 2 months after that.  Use over-the-counter Polysporin ointment, a tiny dab on each of the areas of rash once or twice daily for 7-10 days. If that does not seem to help, try using some over-the-counter hydrocortisone cream 1% in the same fashion. If the lesions continue to persist we will do a little skin biopsy of one of them.  Plan to return in 3 months to recheck the breasts, sooner if further concerns. If the breast is not doing better in the next couple of weeks call back and we will go ahead and schedule a diagnostic mammogram of the breast.     Return in about 3 months (around 09/08/2015).   Oluwatimileyin Vivier, MD 06/08/2015

## 2015-06-08 NOTE — Patient Instructions (Signed)
Take over-the-counter Aleve 2 pills twice daily with breakfast and with supper for 10 days to 2 weeks for anti-inflammatory effect for breast.  Take the terbinafine 250 mg one pill daily. After one month please return for some blood testing of your liver enzymes. Assuming they are normal we will continue the medicine for another 2 months after that.  Use over-the-counter Polysporin ointment, a tiny dab on each of the areas of rash once or twice daily for 7-10 days. If that does not seem to help, try using some over-the-counter hydrocortisone cream 1% in the same fashion. If the lesions continue to persist we will do a little skin biopsy of one of them.  Plan to return in 3 months to recheck the breasts, sooner if further concerns. If the breast is not doing better in the next couple of weeks call back and we will go ahead and schedule a diagnostic mammogram of the breast.

## 2015-06-16 ENCOUNTER — Ambulatory Visit (INDEPENDENT_AMBULATORY_CARE_PROVIDER_SITE_OTHER): Payer: 59 | Admitting: Family Medicine

## 2015-06-16 VITALS — BP 108/70 | HR 95 | Temp 98.7°F | Resp 18 | Ht 60.5 in | Wt 137.8 lb

## 2015-06-16 DIAGNOSIS — M545 Low back pain, unspecified: Secondary | ICD-10-CM

## 2015-06-16 DIAGNOSIS — N920 Excessive and frequent menstruation with regular cycle: Secondary | ICD-10-CM

## 2015-06-16 DIAGNOSIS — N898 Other specified noninflammatory disorders of vagina: Secondary | ICD-10-CM

## 2015-06-16 NOTE — Patient Instructions (Signed)
YOU HAVE AN APPT AT  [facility]: Robley Fries MD [address] [place], [place] [address] AT 2:45 PM [phone]

## 2015-06-16 NOTE — Progress Notes (Signed)
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  She presents with low back pain x 2 weeks associated with spotting.  She denies fever, sweats, chills.  No nausea.  No abdominal pain.  No trauma.  Pain is nonpositional and she has not tried any medications to date.  Patient has quite a strong family history of various kinds of cancer and she is concerned about these new symptoms. She has relative with breast cancer, one with blood cancer, one with colon cancer.  Objective:  NAD BP 108/70 mmHg  Pulse 95  Temp(Src) 98.7 F (37.1 C) (Oral)  Resp 18  Ht 5' 0.5" (1.537 m)  Wt 137 lb 12.8 oz (62.506 kg)  BMI 26.46 kg/m2  SpO2 99%  LMP 05/23/2015 Abdomen:  Soft, nontender with no mass Pelvic:  NEFG, blood in vagina coming from os.  Watery bloody discharge.  IUD visible in os.  Bimanual is unremarkable Back: nontender, normal contour SLR: normal No loss of leg muscle mass  Assessment:  I generally think that this problem is coming from the uterus. Without any history of trauma or signs of axial skeletal problem, and with the vaginal bleeding that started at the same time, I think it's probably either endometritis or some other irritation from the IUD.  Plan: [facility] has generously agreed to see Ms. Salvino at 245 this afternoon.  Signed, Sheila Oats.D.

## 2015-06-25 NOTE — Telephone Encounter (Signed)
No msg °

## 2016-02-26 ENCOUNTER — Ambulatory Visit (INDEPENDENT_AMBULATORY_CARE_PROVIDER_SITE_OTHER): Payer: 59 | Admitting: Physician Assistant

## 2016-02-26 VITALS — BP 120/78 | HR 96 | Temp 98.8°F | Resp 17 | Ht 60.0 in | Wt 135.0 lb

## 2016-02-26 DIAGNOSIS — M25531 Pain in right wrist: Secondary | ICD-10-CM

## 2016-02-26 DIAGNOSIS — L0291 Cutaneous abscess, unspecified: Secondary | ICD-10-CM

## 2016-02-26 MED ORDER — CEPHALEXIN 500 MG PO CAPS: 500.0000 mg | ORAL_CAPSULE | Freq: Two times a day (BID) | ORAL | Status: AC

## 2016-02-26 MED ORDER — MELOXICAM 7.5 MG PO TABS: 7.5000 mg | ORAL_TABLET | Freq: Every day | ORAL | Status: DC

## 2016-02-26 NOTE — Patient Instructions (Addendum)
     IF you received an x-ray today, you will receive an invoice from [facility]. Please contact [facility] at [phone] with questions or concerns regarding your invoice.   IF you received labwork today, you will receive an invoice from [facility]. Please contact [facility] at [phone] with questions or concerns regarding your invoice.   Our billing staff will not be able to assist you with questions regarding bills from these companies.  You will be contacted with the lab results as soon as they are available. The fastest way to get your results is to activate your My Chart account. Instructions are located on the last page of this paperwork. If you have not heard from us regarding the results in 2 weeks, please contact this office.     Please take the meloxicam for your wrist pain.  Do not take ibuprofen or naproxen.  You can take tylenol.  You may also pick up a wrist splint from the pharmacy, [employer], or [facility], to secure the wrist.  If this pain does not improve in 7-10 days, please return.    Please do the warm soaks 4 times per day at least, for 15 minutes.  I would like you to return if this continues to be painful without healing.  If you develop fever, nausea, worsening pain, swelling--please return here.

## 2016-02-26 NOTE — Progress Notes (Signed)
[facility] [address], [place] [address] [phone]  Date:  02/26/2016   Name:  Penny Bishop Joaquin   DOB:  01-23-1981   MRN:  [id number]  PCP:  Ricki RodriguezKADAKIA,AJAY S, MD    History of Present Illness:  Penny Bishop Balan is a 35 y.o. female patient who [facility] for cc ofbump on the back of buttocks.  Last night popped uppale yellowish with blood.  It is very painful.  The other is small.  No fever.  No pruritus.   Wrist pain at the right wrist if she lifts with that hand or anything pushes on her left wrist.  She never injured wrist.  She lifts with this hand repetitively.  3 days ago.  It has improved some .  No swelling or redness.       Patient Active Problem List   Diagnosis Date Noted  . Hair loss 12/29/2014  . Anemia, iron deficiency 12/29/2014    Past Medical History  Diagnosis Date  . Status post repeat low transverse cesarean section 03/23/2011  . Pneumonia 08/20/2014  . Iron deficiency anemia   . Hepatitis B ~ 2001  . Pneumonia involving right lung 08/20/2014  . Maternal fever during labor 03/20/2011    Past Surgical History  Procedure Laterality Date  . Cesarean section  04/05/2009;   . Cesarean section  03/20/2011    Procedure: CESAREAN SECTION;  Surgeon: Kathreen CosierBernard A Marshall, MD;  Location: [facility];  Service: Gynecology;  Laterality: N/A;  cord ph 7.18, fundal massage by A. Green RN    Social History  Substance Use Topics  . Smoking status: Never Smoker   . Smokeless tobacco: Never Used  . Alcohol Use: No    Family History  Problem Relation Age of Onset  . Diabetes Mother   . Hypertension Mother   . Hypertension Father     Allergies  Allergen Reactions  . Chlorhexidine Gluconate Rash    Medication list has been reviewed and updated.  Current Outpatient Prescriptions on File Prior to Visit  Medication Sig Dispense Refill  . iron polysaccharides (NIFEREX) 150 MG capsule Take 150 mg by mouth daily.     No current  facility-administered medications on file prior to visit.    ROS ROS otherwise unremarkable unless listed above.  Physical Examination: BP 120/78 mmHg  Pulse 96  Temp(Src) 98.8 F (37.1 C) (Oral)  Resp 17  Ht 5' (1.524 m)  Wt 135 lb (61.236 kg)  BMI 26.37 kg/m2  SpO2 98%  LMP 01/10/2016 Ideal Body Weight: Weight in (lb) to have BMI = 25: 127.7  Physical Exam  Constitutional: She is oriented to person, place, and time. She appears well-developed and well-nourished. No distress.  HENT:  Head: Normocephalic and atraumatic.  Right Ear: External ear normal.  Left Ear: External ear normal.  Eyes: Conjunctivae and EOM are normal. Pupils are equal, round, and reactive to light.  Cardiovascular: Normal rate.   Pulmonary/Chest: Effort normal. No respiratory distress.  Musculoskeletal:  Minimal tenderness at the radial side of the wrist. There is no swelling, erythema.  There is pain with resisted pronation of the wrist. Normal range of motion. Negative Tinel's or Phalen's. Negative de Quervain's.  Neurological: She is alert and oriented to person, place, and time.  Skin: She is not diaphoretic.  Erythematous lesion with superficial layer removed. No ex-sedate upon palpation. Is very tender. There is a 1 cm erythematous lesion on the right side as well. She has  scarring that appears from previous abscess like ruptures.  Psychiatric: She has a normal mood and affect. Her behavior is normal.   Procedure: Verbal consent obtained. 1% lidocaine placed at the fluctuant site on the left buttocks just inferior to introitus. Cleansed with iodine. 11 blade utilized to place a 1 cm incision. No purulent fluid expressed wound search for loculations, none appreciated.  Assessment and Plan: Penny Bishop Pickler is a 35 y.o. female who is here today for cc of bump at buttocks. This abscess likely ruptured prior to her appearance. I've advised her to decrease shaving regimen in place of other modalities for  cosmetic removal of hair. She will take Keflex at this time. Advised to do warm soaks several times per day. She will return if her symptoms do not improve. Abscess - Plan: meloxicam (MOBIC) 7.5 MG tablet, cephALEXin (KEFLEX) 500 MG capsule, WOUND CULTURE .  Trena PlattStephanie Delesa Kawa, PA-C [facility] 02/26/2016 11:39 AM

## 2016-02-29 ENCOUNTER — Ambulatory Visit (INDEPENDENT_AMBULATORY_CARE_PROVIDER_SITE_OTHER): Payer: 59 | Admitting: Family Medicine

## 2016-02-29 VITALS — BP 102/71 | HR 91 | Temp 98.0°F | Resp 16 | Ht 60.5 in | Wt 136.6 lb

## 2016-02-29 DIAGNOSIS — Z1383 Encounter for screening for respiratory disorder NEC: Secondary | ICD-10-CM

## 2016-02-29 DIAGNOSIS — L659 Nonscarring hair loss, unspecified: Secondary | ICD-10-CM

## 2016-02-29 DIAGNOSIS — E559 Vitamin D deficiency, unspecified: Secondary | ICD-10-CM | POA: Diagnosis not present

## 2016-02-29 DIAGNOSIS — Z136 Encounter for screening for cardiovascular disorders: Secondary | ICD-10-CM

## 2016-02-29 DIAGNOSIS — Z1211 Encounter for screening for malignant neoplasm of colon: Secondary | ICD-10-CM | POA: Diagnosis not present

## 2016-02-29 DIAGNOSIS — Z124 Encounter for screening for malignant neoplasm of cervix: Secondary | ICD-10-CM | POA: Diagnosis not present

## 2016-02-29 DIAGNOSIS — Z0000 Encounter for general adult medical examination without abnormal findings: Secondary | ICD-10-CM

## 2016-02-29 DIAGNOSIS — Z1329 Encounter for screening for other suspected endocrine disorder: Secondary | ICD-10-CM | POA: Diagnosis not present

## 2016-02-29 DIAGNOSIS — D509 Iron deficiency anemia, unspecified: Secondary | ICD-10-CM

## 2016-02-29 DIAGNOSIS — Z1212 Encounter for screening for malignant neoplasm of rectum: Secondary | ICD-10-CM | POA: Diagnosis not present

## 2016-02-29 DIAGNOSIS — Z1389 Encounter for screening for other disorder: Secondary | ICD-10-CM | POA: Diagnosis not present

## 2016-02-29 LAB — POCT URINALYSIS DIP (MANUAL ENTRY)
BILIRUBIN UA: NEGATIVE
BILIRUBIN UA: NEGATIVE
Glucose, UA: NEGATIVE
Nitrite, UA: NEGATIVE
PH UA: 7.5
Protein Ur, POC: NEGATIVE
Spec Grav, UA: 1.015
Urobilinogen, UA: 0.2

## 2016-02-29 LAB — WOUND CULTURE
Gram Stain: NONE SEEN
Gram Stain: NONE SEEN
Gram Stain: NONE SEEN
ORGANISM ID, BACTERIA: NO GROWTH

## 2016-02-29 NOTE — Progress Notes (Signed)
Subjective:  By signing my name below, I, [name], attest that this documentation has been prepared under the direction and in the presence of [name], MD. Electronically Signed: [name], Scribe. 02/29/2016 , 5:04 PM .  Patient was seen in Room 8 .   Patient ID: Penny Bishop, female    DOB: May 13, 1981, 36 y.o.   MRN: [id number] Chief Complaint  Patient presents with  . Annual Exam   HPI Penny Bishop is a 35 y.o. female who presents to [facility] for annual physical.  Patient last had full physical here 14 months prior. She has a copper IUD placed 5 years ago. Her last pap smear was done in 2010. Her last tdap was also in 2010. She's never received flu shots in the past. She denies tobacco or alcohol use. She has family history of HTN in both parents and her mother also has DM. Patient declined pap smear and breast exam during last visit; but was told to return for these. She did have screening lab work done which showed normal TSH and CMP. Lipid panel had low HDL but otherwise normal. CBC was normal, although with borderline hemoglobin and borderline iron.   She's generally doing well. She's been taking multivitamins and [facility] women's complete iron, starting 2 months ago. She's also been increasing iron in her diet as well. She denies taking fish oil. She has been seen by dermatologist (Dr. Betsy Coder) for hair loss, and was advised to take OTC vitamin D. She has seen by dermatologist PA 2 days ago. She's also followed by GYN, Dr. [name], after referred for spotting back in Oct 2016. She mentions having a light lunch earlier today. She denies history of asthma.   She received a tetanus shot about 5~6 years ago. She has 2 children.  She works for [employer].   Past Medical History  Diagnosis Date  . Status post repeat low transverse cesarean section 03/23/2011  . Pneumonia 08/20/2014  . Iron deficiency anemia   . Hepatitis B ~ 2001  . Pneumonia involving right lung 08/20/2014  .  Maternal fever during labor 03/20/2011   Prior to Admission medications   Medication Sig Start Date End Date Taking? Authorizing Provider  Biotin 5000 MCG CAPS Take by mouth.   Yes Historical Provider, MD  cephALEXin (KEFLEX) 500 MG capsule Take 1 capsule (500 mg total) by mouth 2 (two) times daily. 02/26/16 03/04/16 Yes [name], PA  iron polysaccharides (NIFEREX) 150 MG capsule Take 150 mg by mouth daily.   Yes Historical Provider, MD  meloxicam (MOBIC) 7.5 MG tablet Take 1 tablet (7.5 mg total) by mouth daily. 02/26/16  Yes [name], PA  Multiple Vitamin (MULTIVITAMIN) tablet Take 1 tablet by mouth daily.   Yes Historical Provider, MD   Allergies  Allergen Reactions  . Chlorhexidine Gluconate Rash   Past Surgical History  Procedure Laterality Date  . Cesarean section  04/05/2009;   . Cesarean section  03/20/2011    Procedure: CESAREAN SECTION;  Surgeon: [name], MD;  Location: [facility];  Service: Gynecology;  Laterality: N/A;  cord ph 7.18, fundal massage by A. Green RN   Family History  Problem Relation Age of Onset  . Diabetes Mother   . Hypertension Mother   . Hypertension Father    Social History   Social History  . Marital Status: Married    Spouse Name: N/A  . Number of Children: N/A  . Years of Education: N/A  Social History Main Topics  . Smoking status: Never Smoker   . Smokeless tobacco: Never Used  . Alcohol Use: No  . Drug Use: No  . Sexual Activity: Yes   Other Topics Concern  . None   Social History Narrative    Review of Systems  Constitutional: Negative for fever, chills, fatigue and unexpected weight change.  Respiratory: Negative for cough.   Gastrointestinal: Negative for nausea, vomiting, diarrhea and constipation.  Skin: Negative for rash and wound.  Neurological: Negative for dizziness, weakness and headaches.  All other systems reviewed and are negative.      Objective:   Physical Exam  Constitutional: She  is oriented to person, place, and time. She appears well-developed and well-nourished. No distress.  HENT:  Head: Normocephalic and atraumatic.  Right Ear: Tympanic membrane normal.  Left Ear: Tympanic membrane normal.  Nose: Nose normal.  Mouth/Throat: Oropharynx is clear and moist.  Eyes: EOM are normal. Pupils are equal, round, and reactive to light.  Neck: Neck supple. No thyromegaly present.  Cardiovascular: Normal rate, regular rhythm, S1 normal, S2 normal and normal heart sounds.   No murmur heard. Pulmonary/Chest: Effort normal and breath sounds normal. No respiratory distress.  Breast exam normal  Abdominal: Soft. Bowel sounds are normal. She exhibits no distension. There is no tenderness.  Musculoskeletal: Normal range of motion.  Lymphadenopathy:    She has no cervical adenopathy.  No axillary adenopathy  Neurological: She is alert and oriented to person, place, and time.  Reflex Scores:      Patellar reflexes are 2+ on the right side and 2+ on the left side. Skin: Skin is warm and dry.  Psychiatric: She has a normal mood and affect. Her behavior is normal.  Nursing note and vitals reviewed.   BP 102/71 mmHg  Pulse 91  Temp(Src) 98 F (36.7 C) (Oral)  Resp 16  Ht 5' 0.5" (1.537 m)  Wt 136 lb 9.6 oz (61.961 kg)  BMI 26.23 kg/m2  SpO2 99%  LMP 02/10/2016   Results for orders placed or performed in visit on 02/29/16  POCT urinalysis dipstick  Result Value Ref Range   Color, UA yellow yellow   Clarity, UA clear clear   Glucose, UA negative negative   Bilirubin, UA negative negative   Ketones, POC UA negative negative   Spec Grav, UA 1.015    Blood, UA trace-lysed (A) negative   pH, UA 7.5    Protein Ur, POC negative negative   Urobilinogen, UA 0.2    Nitrite, UA Negative Negative   Leukocytes, UA small (1+) (A) Negative       Assessment & Plan:  Dermatologist: Dr. [name]: [address], [address], [place], [place] [id number] Phone  Number: [phone]  Patient decided to forego pap smear today. She believes it was done at [name]'s office in October; plans to follow up annually with Dr. Juliene PinaMody.   1. Annual physical exam   2. Screening for cardiovascular, respiratory, and genitourinary diseases   3. Screening for cervical cancer   4. Screening for colorectal cancer   5. Screening for thyroid disorder   6. Anemia, iron deficiency - increase iron in diet  7. Vitamin D deficiency - increase D supp  8. Hair loss      Orders Placed This Encounter  Procedures  . TSH    Need copy of all labs sent to Dr. [name] at [facility] phone number [phone]  . Comprehensive metabolic panel  Need copy of all labs sent to Dr. [name] at [facility] phone number [phone]    Order Specific Question:  Has the patient fasted?    Answer:  Yes  . VITAMIN D 25 Hydroxy (Vit-D Deficiency, Fractures)    Need copy of all labs sent to Dr. [name] at [facility] phone number [phone]  . Ferritin    Need copy of all labs sent to Dr. [name] at [facility] phone number [phone]  . CBC with Differential/Platelet    Need copy of all labs sent to Dr. [name] at [facility] phone number [phone]  . DHEA-sulfate    Need copy of all labs sent to Dr. [name] at [facility] phone number [phone]  . Testosterone, Free, Total, SHBG    Need copy of all labs sent to Dr. [name] at [facility] phone number [phone]  . Care order/instruction    AVS and GO after labs are drawn    Scheduling Instructions:     AVS and GO  . POCT urinalysis dipstick    Meds ordered this encounter  Medications  . Multiple Vitamin (MULTIVITAMIN) tablet    Sig: Take 1 tablet by mouth daily.  . Biotin 5000 MCG CAPS    Sig: Take by mouth.    I personally performed the  services described in this documentation, which was scribed in my presence. The recorded information has been reviewed and considered, and addended by me as needed.   Norberto SorensonEva Shaw, M.D.  [facility]  [place] [address] [place], [place] [address] [phone] phone [phone]) [phone] fax  03/06/2016 8:18 AM

## 2016-02-29 NOTE — Patient Instructions (Addendum)
IF you received an x-ray today, you will receive an invoice from [facility]. Please contact [facility] at [phone] with questions or concerns regarding your invoice.   IF you received labwork today, you will receive an invoice from [employer]. Please contact [facility] at [phone] with questions or concerns regarding your invoice.   Our billing staff will not be able to assist you with questions regarding bills from these companies.  You will be contacted with the lab results as soon as they are available. The fastest way to get your results is to activate your My Chart account. Instructions are located on the last page of this paperwork. If you have not heard from [place] regarding the results in 2 weeks, please contact this office.     Keeping You Healthy  Get These Tests  Blood Pressure- Have your blood pressure checked once a year by your health care provider.  Normal blood pressure is 120/80.  Weight- Have your body mass index (BMI) calculated to screen for obesity.  BMI is measure of body fat based on height and weight.  You can also calculate your own BMI at GravelBags.it.  Cholesterol- Have your cholesterol checked every 5 years starting at age 15 then yearly starting at age 51.  Chlamydia, HIV, and other sexually transmitted diseases- Get screened every year until age 6, then within three months of each new sexual provider.  Pap Test - Every 1-5 years; discuss with your health care provider.  Mammogram- Every 1-2 years starting at age 75--50  Take these medicines  Calcium with Vitamin D-Your body needs 1200 mg of Calcium each day and [phone] IU of Vitamin D daily.  Your body can only absorb 500 mg of Calcium at a time so Calcium must be taken in 2 or 3 divided doses throughout the day.  Multivitamin with folic acid- Once daily if it is possible for you to become pregnant.  Get these  Immunizations  Gardasil-Series of three doses; prevents HPV related illness such as genital warts and cervical cancer.  Menactra-Single dose; prevents meningitis.  Tetanus shot- Every 10 years.  Flu shot-Every year.  Take these steps  Do not smoke-Your healthcare provider can help you quit.  For tips on how to quit go to www.smokefree.gov or call 1-800 QUITNOW.  Be physically active- Exercise 5 days a week for at least 30 minutes.  If you are not already physically active, start slow and gradually work up to 30 minutes of moderate physical activity.  Examples of moderate activity include walking briskly, dancing, swimming, bicycling, etc.  Breast Cancer- A self breast exam every month is important for early detection of breast cancer.  For more information and instruction on self breast exams, ask your healthcare provider or https://www.Skeet.info/.  Eat a healthy diet- Eat a variety of healthy foods such as fruits, vegetables, whole grains, low fat milk, low fat cheeses, yogurt, lean meats, poultry and fish, beans, nuts, tofu, etc.  For more information go to www. Thenutritionsource.org  Drink alcohol in moderation- Limit alcohol intake to one drink or less per day. Never drink and drive.  Depression- Your emotional health is as important as your physical health.  If you're feeling down or losing interest in things you normally enjoy please talk to your healthcare provider about being screened for depression.  Dental visit- Brush and floss your teeth twice daily; visit your dentist twice a year.  Eye doctor- Get an eye exam at least every  2 years.  Helmet use- Always wear a helmet when riding a bicycle, motorcycle, rollerblading or skateboarding.  Safe sex- If you may be exposed to sexually transmitted infections, use a condom.  Seat belts- Seat belts can save your live; always wear one.  Smoke/Carbon Monoxide detectors- These detectors need to be installed on  the appropriate level of your home. Replace batteries at least once a year.  Skin cancer- When out in the sun please cover up and use sunscreen 15 SPF or higher.  Violence- If anyone is threatening or hurting you, please tell your healthcare provider.    Vitamin D Deficiency Vitamin D deficiency is when your body does not have enough vitamin D. Vitamin D is important to your body for many reasons:  It helps the body to absorb two important minerals, called calcium and phosphorus.  It plays a role in bone health.  It may help to prevent some diseases, such as diabetes and multiple sclerosis.  It plays a role in muscle function, including heart function. You can get vitamin D by:  Eating foods that naturally contain vitamin D.  Eating or drinking milk or other dairy products that have vitamin D added to them.  Taking a vitamin D supplement or a multivitamin supplement that contains vitamin D.  Being in the sun. Your body naturally makes vitamin D when your skin is exposed to sunlight. Your body changes the sunlight into a form of the vitamin that the body can use. If vitamin D deficiency is severe, it can cause a condition in which your bones become soft. In adults, this condition is called osteomalacia. In children, this condition is called rickets. CAUSES Vitamin D deficiency may be caused by:  Not eating enough foods that contain vitamin D.  Not getting enough sun exposure.  Having certain digestive system diseases that make it difficult for your body to absorb vitamin D. These diseases include Crohn disease, chronic pancreatitis, and cystic fibrosis.  Having a surgery in which a part of the stomach or a part of the small intestine is removed.  Being obese.  Having chronic kidney disease or liver disease. RISK FACTORS This condition is more likely to develop in:  Older people.  People who do not spend much time outdoors.  People who live in a long-term care  facility.  People who have had broken bones.  People with weak or thin bones (osteoporosis).  People who have a disease or condition that changes how the body absorbs vitamin D.  People who have dark skin.  People who take certain medicines, such as steroid medicines or certain seizure medicines.  People who are overweight or obese. SYMPTOMS In mild cases of vitamin D deficiency, there may not be any symptoms. If the condition is severe, symptoms may include:  Bone pain.  Muscle pain.  Falling often.  Broken bones caused by a minor injury. DIAGNOSIS This condition is usually diagnosed with a blood test.  TREATMENT Treatment for this condition may depend on what caused the condition. Treatment options include:  Taking vitamin D supplements.  Taking a calcium supplement. Your health care provider will suggest what dose is best for you. HOME CARE INSTRUCTIONS  Take medicines and supplements only as told by your health care provider.  Eat foods that contain vitamin D. Choices include:  Fortified dairy products, cereals, or juices. Fortified means that vitamin D has been added to the food. Check the label on the package to be sure.  Fatty fish, such  as salmon or trout.  Eggs.  Oysters.  Do not use a tanning bed.  Maintain a healthy weight. Lose weight, if needed.  Keep all follow-up visits as told by your health care provider. This is important. SEEK MEDICAL CARE IF:  Your symptoms do not go away.  You feel like throwing up (nausea) or you throw up (vomit).  You have fewer bowel movements than usual or it is difficult for you to have a bowel movement (constipation).   This information is not intended to replace advice given to you by your health care provider. Make sure you discuss any questions you have with your health care provider.   Document Released: 11/14/2011 Document Revised: 05/13/2015 Document Reviewed: 01/07/2015 Elsevier Interactive Patient  Education 2016 [employer].   Iron-Rich Diet Iron is a mineral that helps your body to produce hemoglobin. Hemoglobin is a protein in your red blood cells that carries oxygen to your body's tissues. Eating too little iron may cause you to feel weak and tired, and it can increase your risk for infection. Eating enough iron is necessary for your body's metabolism, muscle function, and nervous system. Iron is naturally found in many foods. It can also be added to foods or fortified in foods. There are two types of dietary iron:  Heme iron. Heme iron is absorbed by the body more easily than nonheme iron. Heme iron is found in meat, poultry, and fish.  Nonheme iron. Nonheme iron is found in dietary supplements, iron-fortified grains, beans, and vegetables. You may need to follow an iron-rich diet if:  You have been diagnosed with iron deficiency or iron-deficiency anemia.  You have a condition that prevents you from absorbing dietary iron, such as:  Infection in your intestines.  Celiac disease. This involves long-lasting (chronic) inflammation of your intestines.  You do not eat enough iron.  You eat a diet that is high in foods that impair iron absorption.  You have lost a lot of blood.  You have heavy bleeding during your menstrual cycle.  You are pregnant. WHAT IS MY PLAN? Your health care provider may help you to determine how much iron you need per day based on your condition. Generally, when a person consumes sufficient amounts of iron in the diet, the following iron needs are met:  Men.  16-14 years old: 11 mg per day.  77-60 years old: 8 mg per day.  Women.   38-22 years old: 15 mg per day.  17-65 years old: 18 mg per day.  Over 33 years old: 8 mg per day.  Pregnant women: 27 mg per day.  Breastfeeding women: 9 mg per day. WHAT DO I NEED TO KNOW ABOUT AN IRON-RICH DIET?  Eat fresh fruits and vegetables that are high in vitamin C along with foods that are high  in iron. This will help increase the amount of iron that your body absorbs from food, especially with foods containing nonheme iron. Foods that are high in vitamin C include oranges, peppers, tomatoes, and mango.  Take iron supplements only as directed by your health care provider. Overdose of iron can be life-threatening. If you were prescribed iron supplements, take them with orange juice or a vitamin C supplement.  Cook foods in pots and pans that are made from iron.   Eat nonheme iron-containing foods alongside foods that are high in heme iron. This helps to improve your iron absorption.   Certain foods and drinks contain compounds that impair iron absorption. Avoid eating  these foods in the same meal as iron-rich foods or with iron supplements. These include:  Coffee, black tea, and red wine.  Milk, dairy products, and foods that are high in calcium.  Beans, soybeans, and peas.  Whole grains.  When eating foods that contain both nonheme iron and compounds that impair iron absorption, follow these tips to absorb iron better.   Soak beans overnight before cooking.  Soak whole grains overnight and drain them before using.  Ferment flours before baking, such as using yeast in bread dough. WHAT FOODS CAN I EAT? Grains Iron-fortified breakfast cereal. Iron-fortified whole-wheat bread. Enriched rice. Sprouted grains. Vegetables Spinach. Potatoes with skin. Green peas. Broccoli. Red and green bell peppers. Fermented vegetables. Fruits Prunes. Raisins. Oranges. Strawberries. Mango. Grapefruit. Meats and Other Protein Sources Beef liver. Oysters. Beef. Shrimp. [place]. Chicken. [place]. Sardines. Chickpeas. Nuts. Tofu. Beverages Tomato juice. Fresh orange juice. Prune juice. Hibiscus tea. Fortified instant breakfast shakes. Condiments Tahini. Fermented soy sauce. Sweets and Desserts Black-strap molasses.  Other Wheat germ. The items listed above may not be a complete list  of recommended foods or beverages. Contact your dietitian for more options. WHAT FOODS ARE NOT RECOMMENDED? Grains Whole grains. Bran cereal. Bran flour. Oats. Vegetables Artichokes. Brussels sprouts. Kale. Fruits Blueberries. Raspberries. Strawberries. Figs. Meats and Other Protein Sources Soybeans. Products made from soy protein. Dairy Milk. Cream. Cheese. Yogurt. Cottage cheese. Beverages Coffee. Black tea. Red wine. Sweets and Desserts Cocoa. Chocolate. Ice cream. Other Basil. Oregano. Parsley. The items listed above may not be a complete list of foods and beverages to avoid. Contact your dietitian for more information.   This information is not intended to replace advice given to you by your health care provider. Make sure you discuss any questions you have with your health care provider.   Document Released: 04/05/2005 Document Revised: 09/12/2014 Document Reviewed: 03/19/2014 Elsevier Interactive Patient Education [employer].

## 2016-03-02 LAB — COMPREHENSIVE METABOLIC PANEL
ALT: 22 IU/L (ref 0–32)
AST: 17 IU/L (ref 0–40)
Albumin/Globulin Ratio: 1.4 (ref 1.2–2.2)
Albumin: 4.3 g/dL (ref 3.5–5.5)
Alkaline Phosphatase: 93 IU/L (ref 39–117)
BUN/Creatinine Ratio: 12 (ref 9–23)
BUN: 8 mg/dL (ref 6–20)
Bilirubin Total: 0.2 mg/dL (ref 0.0–1.2)
CALCIUM: 9.4 mg/dL (ref 8.7–10.2)
CO2: 24 mmol/L (ref 18–29)
CREATININE: 0.65 mg/dL (ref 0.57–1.00)
Chloride: 99 mmol/L (ref 96–106)
GFR calc Af Amer: 134 mL/min/{1.73_m2} (ref >59)
GFR, EST NON AFRICAN AMERICAN: 116 mL/min/{1.73_m2} (ref >59)
Globulin, Total: 3.1 g/dL (ref 1.5–4.5)
Glucose: 82 mg/dL (ref 65–99)
Potassium: 4.5 mmol/L (ref 3.5–5.2)
Sodium: 138 mmol/L (ref 134–144)
Total Protein: 7.4 g/dL (ref 6.0–8.5)

## 2016-03-02 LAB — FERRITIN: FERRITIN: 39 ng/mL (ref 15–150)

## 2016-03-02 LAB — DHEA-SULFATE: DHEA SO4: 95.3 ug/dL (ref 84.8–378.0)

## 2016-03-02 LAB — CBC WITH DIFFERENTIAL/PLATELET
BASOS ABS: 0 10*3/uL (ref 0.0–0.2)
Basos: 0 %
EOS (ABSOLUTE): 0.1 10*3/uL (ref 0.0–0.4)
Eos: 1 %
HEMOGLOBIN: 11.5 g/dL (ref 11.1–15.9)
Hematocrit: 35.9 % (ref 34.0–46.6)
IMMATURE GRANS (ABS): 0 10*3/uL (ref 0.0–0.1)
Immature Granulocytes: 0 %
LYMPHS: 26 %
Lymphocytes Absolute: 2.3 10*3/uL (ref 0.7–3.1)
MCH: 26.6 pg (ref 26.6–33.0)
MCHC: 32 g/dL (ref 31.5–35.7)
MCV: 83 fL (ref 79–97)
MONOCYTES: 4 %
Monocytes Absolute: 0.4 10*3/uL (ref 0.1–0.9)
Neutrophils Absolute: 6.2 10*3/uL (ref 1.4–7.0)
Neutrophils: 69 %
Platelets: 245 10*3/uL (ref 150–379)
RBC: 4.32 x10E6/uL (ref 3.77–5.28)
RDW: 14.1 % (ref 12.3–15.4)
WBC: 9 10*3/uL (ref 3.4–10.8)

## 2016-03-02 LAB — TESTOSTERONE, FREE, TOTAL, SHBG
SEX HORMONE BINDING: 26.2 nmol/L (ref 24.6–122.0)
TESTOSTERONE: 36 ng/dL (ref 8–48)
Testosterone, Free: 1.4 pg/mL (ref 0.0–4.2)

## 2016-03-02 LAB — VITAMIN D 25 HYDROXY (VIT D DEFICIENCY, FRACTURES): Vit D, 25-Hydroxy: 57.1 ng/mL (ref 30.0–100.0)

## 2016-03-02 LAB — TSH: TSH: 1.62 u[IU]/mL (ref 0.450–4.500)

## 2016-03-25 ENCOUNTER — Telehealth: Payer: Self-pay

## 2016-03-25 ENCOUNTER — Encounter: Payer: Self-pay | Admitting: Family Medicine

## 2016-03-25 NOTE — Telephone Encounter (Signed)
Pt. Called for lab resutls, also requested a copy to be mailed to her

## 2016-03-25 NOTE — Telephone Encounter (Signed)
Labs normal, letter mailed

## 2016-04-06 NOTE — Telephone Encounter (Signed)
Pt.notified

## 2017-08-10 ENCOUNTER — Encounter: Payer: Self-pay | Admitting: Family Medicine

## 2017-08-10 ENCOUNTER — Other Ambulatory Visit: Payer: Self-pay

## 2017-08-10 ENCOUNTER — Ambulatory Visit (INDEPENDENT_AMBULATORY_CARE_PROVIDER_SITE_OTHER): Payer: 59 | Admitting: Family Medicine

## 2017-08-10 VITALS — BP 120/60 | HR 67 | Temp 98.4°F | Resp 18 | Ht 60.05 in | Wt 130.2 lb

## 2017-08-10 DIAGNOSIS — Z0000 Encounter for general adult medical examination without abnormal findings: Secondary | ICD-10-CM

## 2017-08-10 DIAGNOSIS — Z131 Encounter for screening for diabetes mellitus: Secondary | ICD-10-CM | POA: Diagnosis not present

## 2017-08-10 NOTE — Patient Instructions (Addendum)

## 2017-08-10 NOTE — Progress Notes (Signed)
Chief Complaint  Patient presents with  . Annual Exam    Subjective:  Penny Bishop is a 36 y.o. female here for a health maintenance visit.  Patient is established pt  Patient Active Problem List   Diagnosis Date Noted  . Hair loss 12/29/2014  . Anemia, iron deficiency 12/29/2014    Past Medical History:  Diagnosis Date  . Hepatitis B ~ 2001  . Iron deficiency anemia   . Maternal fever during labor 03/20/2011  . Pneumonia 08/20/2014  . Pneumonia involving right lung 08/20/2014  . Status post repeat low transverse cesarean section 03/23/2011    Past Surgical History:  Procedure Laterality Date  . CESAREAN SECTION  04/05/2009;   . CESAREAN SECTION  03/20/2011   Procedure: CESAREAN SECTION;  Surgeon: Frederico Hamman, MD;  Location: [facility];  Service: Gynecology;  Laterality: N/A;  cord ph 7.18, fundal massage by A. Green Therapist, sports  . COSMETIC SURGERY       Outpatient Medications Prior to Visit  Medication Sig Dispense Refill  . Biotin 5000 MCG CAPS Take by mouth.    . Multiple Vitamin (MULTIVITAMIN) tablet Take 1 tablet by mouth daily.    . iron polysaccharides (NIFEREX) 150 MG capsule Take 150 mg by mouth daily.    . meloxicam (MOBIC) 7.5 MG tablet Take 1 tablet (7.5 mg total) by mouth daily. (Patient not taking: Reported on 08/10/2017) 30 tablet 0   No facility-administered medications prior to visit.     Allergies  Allergen Reactions  . Chlorhexidine Gluconate Rash     Family History  Problem Relation Age of Onset  . Diabetes Mother   . Hypertension Mother   . Hypertension Father   . Diabetes Maternal Grandmother   . Diabetes Maternal Grandfather   . Hypertension Paternal Grandmother   . COPD Paternal Grandfather      Health Habits: Dental Exam: up to date Eye Exam: up to date Exercise: 4 times/week on average Current exercise activities: walking/running Diet: balanced  Social History   Socioeconomic History  . Marital status: Married    Spouse name:  Not on file  . Number of children: Not on file  . Years of education: Not on file  . Highest education level: Not on file  Social Needs  . Financial resource strain: Not on file  . Food insecurity - worry: Not on file  . Food insecurity - inability: Not on file  . Transportation needs - medical: Not on file  . Transportation needs - non-medical: Not on file  Occupational History  . Not on file  Tobacco Use  . Smoking status: Never Smoker  . Smokeless tobacco: Never Used  Substance and Sexual Activity  . Alcohol use: No  . Drug use: No  . Sexual activity: Yes  Other Topics Concern  . Not on file  Social History Narrative  . Not on file   Social History   Substance and Sexual Activity  Alcohol Use No   Social History   Tobacco Use  Smoking Status Never Smoker  Smokeless Tobacco Never Used   Social History   Substance and Sexual Activity  Drug Use No    GYN: Sexual Health Menstrual status: regular menses LMP: Patient's last menstrual period was 07/23/2017. Last pap smear: see HM section, last year with Gynecology wnl History of abnormal pap smears: none Sexually active: with female partner Current contraception: IUD mirena  Health Maintenance: See under health Maintenance activity for review of completion dates as well. Immunization  History  Administered Date(s) Administered  . DTaP 12/28/2008  . Influenza-Unspecified 06/05/2017  . Tdap 03/20/2011      Depression Screen-PHQ2/9 Depression screen [facility] 2/9 08/10/2017 02/29/2016 02/26/2016 06/16/2015 06/08/2015  Decreased Interest 0 0 0 0 0  Down, Depressed, Hopeless 0 0 0 0 0  PHQ - 2 Score 0 0 0 0 0       Depression Severity and Treatment Recommendations:  0-4= None  5-9= Mild / Treatment: Support, educate to call if worse; return in one month  10-14= Moderate / Treatment: Support, watchful waiting; Antidepressant or Psycotherapy  15-19= Moderately severe / Treatment: Antidepressant OR Psychotherapy  >=  20 = Major depression, severe / Antidepressant AND Psychotherapy    Review of Systems   Review of Systems  Constitutional: Negative for chills and fever.  HENT: Negative for ear discharge, ear pain, hearing loss and tinnitus.   Eyes: Negative for blurred vision, double vision and photophobia.  Respiratory: Negative for cough, sputum production and wheezing.   Cardiovascular: Negative for chest pain, palpitations and orthopnea.  Gastrointestinal: Negative for abdominal pain, blood in stool, constipation, diarrhea, nausea and vomiting.  Genitourinary: Negative for dysuria, frequency and urgency.  Musculoskeletal: Negative for back pain, falls, joint pain, myalgias and neck pain.  Skin: Negative for itching and rash.  Neurological: Negative for dizziness, tingling, tremors and headaches.  Psychiatric/Behavioral: Negative for depression and hallucinations. The patient is not nervous/anxious and does not have insomnia.     See HPI for ROS as well.    Objective:   Vitals:   08/10/17 0945  BP: 120/60  Pulse: 67  Resp: 18  Temp: 98.4 F (36.9 C)  TempSrc: Oral  SpO2: 99%  Weight: 130 lb 3.2 oz (59.1 kg)  Height: 5' 0.05" (1.525 m)    Body mass index is 25.39 kg/m.  Physical Exam  BP 120/60 (BP Location: Left Arm, Patient Position: Sitting, Cuff Size: Normal)   Pulse 67   Temp 98.4 F (36.9 C) (Oral)   Resp 18   Ht 5' 0.05" (1.525 m)   Wt 130 lb 3.2 oz (59.1 kg)   LMP 07/23/2017   SpO2 99%   BMI 25.39 kg/m  General appearance: alert and appears stated age Head: Normocephalic, without obvious abnormality, atraumatic Eyes: conjunctivae/corneas clear. PERRL, EOM's intact. Fundi benign. Ears: normal TM's and external ear canals both ears Nose: Nares normal. Septum midline. Mucosa normal. No drainage or sinus tenderness. Throat: lips, mucosa, and tongue normal; teeth and gums normal Neck: no adenopathy, no carotid bruit, no JVD, supple, symmetrical, trachea midline and  thyroid not enlarged, symmetric, no tenderness/mass/nodules Back: symmetric, no curvature. ROM normal. No CVA tenderness. Lungs: clear to auscultation bilaterally Breasts: normal appearance, no masses or tenderness Heart: regular rate and rhythm, S1, S2 normal, no murmur, click, rub or gallop Abdomen: soft, non-tender; bowel sounds normal; no masses,  no organomegaly Pelvic: deferred pap smear up to date Extremities: extremities normal, atraumatic, no cyanosis or edema Pulses: 2+ and symmetric Skin: Skin color, texture, turgor normal. No rashes or lesions Lymph nodes: Cervical, supraclavicular, and axillary nodes normal. Neurologic: Alert and oriented X 3, normal strength and tone. Normal symmetric reflexes. Normal coordination and gait    Assessment/Plan:   Patient was seen for a health maintenance exam.  Counseled the patient on health maintenance issues. Reviewed her health mainteance schedule and ordered appropriate tests (see orders.) Counseled on regular exercise and weight management. Recommend regular eye exams and dental cleaning.   The following issues  were addressed today for health maintenance:   Ryin was seen today for annual exam.  Diagnoses and all orders for this visit:  Annual physical exam- advised pt to continue healthy diet and exercise program  -     Lipid panel -     Basic metabolic panel -     Hemoglobin A1c  Screening for diabetes mellitus -     Hemoglobin A1c    Return in about 1 year (around 08/10/2018).    Body mass index is 25.39 kg/m.:  Discussed the patient's BMI with patient. The BMI body mass index is 25.39 kg/m.     No future appointments.  Patient Instructions  Health Maintenance, Female Adopting a healthy lifestyle and getting preventive care can go a long way to promote health and wellness. Talk with your health care provider about what schedule of regular examinations is right for you. This is a good chance for you to check in  with your provider about disease prevention and staying healthy. In between checkups, there are plenty of things you can do on your own. Experts have done a lot of research about which lifestyle changes and preventive measures are most likely to keep you healthy. Ask your health care provider for more information. Weight and diet Eat a healthy diet  Be sure to include plenty of vegetables, fruits, low-fat dairy products, and lean protein.  Do not eat a lot of foods high in solid fats, added sugars, or salt.  Get regular exercise. This is one of the most important things you can do for your health. ? Most adults should exercise for at least 150 minutes each week. The exercise should increase your heart rate and make you sweat (moderate-intensity exercise). ? Most adults should also do strengthening exercises at least twice a week. This is in addition to the moderate-intensity exercise.  Maintain a healthy weight  Body mass index (BMI) is a measurement that can be used to identify possible weight problems. It estimates body fat based on height and weight. Your health care provider can help determine your BMI and help you achieve or maintain a healthy weight.  For females 71 years of age and older: ? A BMI below 18.5 is considered underweight. ? A BMI of 18.5 to 24.9 is normal. ? A BMI of 25 to 29.9 is considered overweight. ? A BMI of 30 and above is considered obese.  Watch levels of cholesterol and blood lipids  You should start having your blood tested for lipids and cholesterol at 35 years of age, then have this test every 5 years.  You may need to have your cholesterol levels checked more often if: ? Your lipid or cholesterol levels are high. ? You are older than 35 years of age. ? You are at high risk for heart disease.  Cancer screening Lung Cancer  Lung cancer screening is recommended for adults 45-46 years old who are at high risk for lung cancer because of a history of  smoking.  A yearly low-dose CT scan of the lungs is recommended for people who: ? Currently smoke. ? Have quit within the past 15 years. ? Have at least a 30-pack-year history of smoking. A pack year is smoking an average of one pack of cigarettes a day for 1 year.  Yearly screening should continue until it has been 15 years since you quit.  Yearly screening should stop if you develop a health problem that would prevent you from having lung cancer treatment.  Breast Cancer  Practice breast self-awareness. This means understanding how your breasts normally appear and feel.  It also means doing regular breast self-exams. Let your health care provider know about any changes, no matter how small.  If you are in your 20s or 30s, you should have a clinical breast exam (CBE) by a health care provider every 1-3 years as part of a regular health exam.  If you are 71 or older, have a CBE every year. Also consider having a breast X-ray (mammogram) every year.  If you have a family history of breast cancer, talk to your health care provider about genetic screening.  If you are at high risk for breast cancer, talk to your health care provider about having an MRI and a mammogram every year.  Breast cancer gene (BRCA) assessment is recommended for women who have family members with BRCA-related cancers. BRCA-related cancers include: ? Breast. ? Ovarian. ? Tubal. ? Peritoneal cancers.  Results of the assessment will determine the need for genetic counseling and BRCA1 and BRCA2 testing.  Cervical Cancer Your health care provider may recommend that you be screened regularly for cancer of the pelvic organs (ovaries, uterus, and vagina). This screening involves a pelvic examination, including checking for microscopic changes to the surface of your cervix (Pap test). You may be encouraged to have this screening done every 3 years, beginning at age 35.  For women ages 52-65, health care providers may  recommend pelvic exams and Pap testing every 3 years, or they may recommend the Pap and pelvic exam, combined with testing for human papilloma virus (HPV), every 5 years. Some types of HPV increase your risk of cervical cancer. Testing for HPV may also be done on women of any age with unclear Pap test results.  Other health care providers may not recommend any screening for nonpregnant women who are considered low risk for pelvic cancer and who do not have symptoms. Ask your health care provider if a screening pelvic exam is right for you.  If you have had past treatment for cervical cancer or a condition that could lead to cancer, you need Pap tests and screening for cancer for at least 20 years after your treatment. If Pap tests have been discontinued, your risk factors (such as having a new sexual partner) need to be reassessed to determine if screening should resume. Some women have medical problems that increase the chance of getting cervical cancer. In these cases, your health care provider may recommend more frequent screening and Pap tests.  Colorectal Cancer  This type of cancer can be detected and often prevented.  Routine colorectal cancer screening usually begins at 34 years of age and continues through 35 years of age.  Your health care provider may recommend screening at an earlier age if you have risk factors for colon cancer.  Your health care provider may also recommend using home test kits to check for hidden blood in the stool.  A small camera at the end of a tube can be used to examine your colon directly (sigmoidoscopy or colonoscopy). This is done to check for the earliest forms of colorectal cancer.  Routine screening usually begins at age 80.  Direct examination of the colon should be repeated every 5-10 years through 35 years of age. However, you may need to be screened more often if early forms of precancerous polyps or small growths are found.  Skin Cancer  Check  your skin from head to toe regularly.  Tell  your health care provider about any new moles or changes in moles, especially if there is a change in a mole's shape or color.  Also tell your health care provider if you have a mole that is larger than the size of a pencil eraser.  Always use sunscreen. Apply sunscreen liberally and repeatedly throughout the day.  Protect yourself by wearing long sleeves, pants, a wide-brimmed hat, and sunglasses whenever you are outside.  Heart disease, diabetes, and high blood pressure  High blood pressure causes heart disease and increases the risk of stroke. High blood pressure is more likely to develop in: ? People who have blood pressure in the high end of the normal range (130-139/85-89 mm Hg). ? People who are overweight or obese. ? People who are African American.  If you are 60-94 years of age, have your blood pressure checked every 3-5 years. If you are 28 years of age or older, have your blood pressure checked every year. You should have your blood pressure measured twice-once when you are at a hospital or clinic, and once when you are not at a hospital or clinic. Record the average of the two measurements. To check your blood pressure when you are not at a hospital or clinic, you can use: ? An automated blood pressure machine at a pharmacy. ? A home blood pressure monitor.  If you are between 56 years and 21 years old, ask your health care provider if you should take aspirin to prevent strokes.  Have regular diabetes screenings. This involves taking a blood sample to check your fasting blood sugar level. ? If you are at a normal weight and have a low risk for diabetes, have this test once every three years after 35 years of age. ? If you are overweight and have a high risk for diabetes, consider being tested at a younger age or more often. Preventing infection Hepatitis B  If you have a higher risk for hepatitis B, you should be screened for this  virus. You are considered at high risk for hepatitis B if: ? You were born in a country where hepatitis B is common. Ask your health care provider which countries are considered high risk. ? Your parents were born in a high-risk country, and you have not been immunized against hepatitis B (hepatitis B vaccine). ? You have HIV or AIDS. ? You use needles to inject street drugs. ? You live with someone who has hepatitis B. ? You have had sex with someone who has hepatitis B. ? You get hemodialysis treatment. ? You take certain medicines for conditions, including cancer, organ transplantation, and autoimmune conditions.  Hepatitis C  Blood testing is recommended for: ? Everyone born from [birth year] through 1964. ? Anyone with known risk factors for hepatitis C.  Sexually transmitted infections (STIs)  You should be screened for sexually transmitted infections (STIs) including gonorrhea and chlamydia if: ? You are sexually active and are younger than 35 years of age. ? You are older than 35 years of age and your health care provider tells you that you are at risk for this type of infection. ? Your sexual activity has changed since you were last screened and you are at an increased risk for chlamydia or gonorrhea. Ask your health care provider if you are at risk.  If you do not have HIV, but are at risk, it may be recommended that you take a prescription medicine daily to prevent HIV infection. This is called pre-exposure prophylaxis (  PrEP). You are considered at risk if: ? You are sexually active and do not regularly use condoms or know the HIV status of your partner(s). ? You take drugs by injection. ? You are sexually active with a partner who has HIV.  Talk with your health care provider about whether you are at high risk of being infected with HIV. If you choose to begin PrEP, you should first be tested for HIV. You should then be tested every 3 months for as long as you are taking  PrEP. Pregnancy  If you are premenopausal and you may become pregnant, ask your health care provider about preconception counseling.  If you may become pregnant, take 400 to 800 micrograms (mcg) of folic acid every day.  If you want to prevent pregnancy, talk to your health care provider about birth control (contraception). Osteoporosis and menopause  Osteoporosis is a disease in which the bones lose minerals and strength with aging. This can result in serious bone fractures. Your risk for osteoporosis can be identified using a bone density scan.  If you are 80 years of age or older, or if you are at risk for osteoporosis and fractures, ask your health care provider if you should be screened.  Ask your health care provider whether you should take a calcium or vitamin D supplement to lower your risk for osteoporosis.  Menopause may have certain physical symptoms and risks.  Hormone replacement therapy may reduce some of these symptoms and risks. Talk to your health care provider about whether hormone replacement therapy is right for you. Follow these instructions at home:  Schedule regular health, dental, and eye exams.  Stay current with your immunizations.  Do not use any tobacco products including cigarettes, chewing tobacco, or electronic cigarettes.  If you are pregnant, do not drink alcohol.  If you are breastfeeding, limit how much and how often you drink alcohol.  Limit alcohol intake to no more than 1 drink per day for nonpregnant women. One drink equals 12 ounces of beer, 5 ounces of wine, or 1 ounces of hard liquor.  Do not use street drugs.  Do not share needles.  Ask your health care provider for help if you need support or information about quitting drugs.  Tell your health care provider if you often feel depressed.  Tell your health care provider if you have ever been abused or do not feel safe at home. This information is not intended to replace advice given  to you by your health care provider. Make sure you discuss any questions you have with your health care provider. Document Released: 03/07/2011 Document Revised: 01/28/2016 Document Reviewed: 05/26/2015 Elsevier Interactive Patient Education  [employer].

## 2017-08-11 LAB — LIPID PANEL
CHOL/HDL RATIO: 2.8 ratio (ref 0.0–4.4)
Cholesterol, Total: 122 mg/dL (ref 100–199)
HDL: 44 mg/dL (ref >39)
LDL Calculated: 62 mg/dL (ref 0–99)
Triglycerides: 80 mg/dL (ref 0–149)
VLDL CHOLESTEROL CAL: 16 mg/dL (ref 5–40)

## 2017-08-11 LAB — BASIC METABOLIC PANEL
BUN / CREAT RATIO: 16 (ref 9–23)
BUN: 10 mg/dL (ref 6–20)
CHLORIDE: 104 mmol/L (ref 96–106)
CO2: 23 mmol/L (ref 20–29)
Calcium: 9.4 mg/dL (ref 8.7–10.2)
Creatinine, Ser: 0.64 mg/dL (ref 0.57–1.00)
GFR calc Af Amer: 134 mL/min/{1.73_m2} (ref >59)
GFR calc non Af Amer: 116 mL/min/{1.73_m2} (ref >59)
GLUCOSE: 92 mg/dL (ref 65–99)
Potassium: 4.9 mmol/L (ref 3.5–5.2)
SODIUM: 140 mmol/L (ref 134–144)

## 2017-08-11 LAB — HEMOGLOBIN A1C
Est. average glucose Bld gHb Est-mCnc: 117 mg/dL
HEMOGLOBIN A1C: 5.7 % — AB (ref 4.8–5.6)

## 2017-08-16 ENCOUNTER — Encounter: Payer: Self-pay | Admitting: Family Medicine

## 2017-08-17 ENCOUNTER — Telehealth: Payer: Self-pay

## 2017-08-17 NOTE — Telephone Encounter (Signed)
Lab results sent via mail to home address. Dgaddy, CMA 

## 2017-10-05 ENCOUNTER — Telehealth: Payer: Self-pay | Admitting: Physician Assistant

## 2017-10-05 ENCOUNTER — Ambulatory Visit (INDEPENDENT_AMBULATORY_CARE_PROVIDER_SITE_OTHER): Payer: Managed Care, Other (non HMO) | Admitting: Physician Assistant

## 2017-10-05 ENCOUNTER — Other Ambulatory Visit: Payer: Self-pay

## 2017-10-05 ENCOUNTER — Encounter: Payer: Self-pay | Admitting: Physician Assistant

## 2017-10-05 VITALS — BP 120/74 | HR 92 | Temp 98.2°F | Ht 60.24 in | Wt 126.4 lb

## 2017-10-05 DIAGNOSIS — R6889 Other general symptoms and signs: Secondary | ICD-10-CM | POA: Diagnosis not present

## 2017-10-05 DIAGNOSIS — Z20828 Contact with and (suspected) exposure to other viral communicable diseases: Secondary | ICD-10-CM

## 2017-10-05 LAB — POC INFLUENZA A&B (BINAX/QUICKVUE)
Influenza A, POC: NEGATIVE
Influenza B, POC: NEGATIVE

## 2017-10-05 MED ORDER — BALOXAVIR MARBOXIL(40 MG DOSE) 2 X 20 MG PO TBPK
40.0000 mg | ORAL_TABLET | Freq: Once | ORAL | 0 refills | Status: AC
Start: 2017-10-05 — End: 2017-10-05

## 2017-10-05 NOTE — Telephone Encounter (Signed)
Copied from CRM [phone]. Topic: Quick Communication - Rx Refill/Question >> Oct 05, 2017  4:39 PM Clack, Princella PellegriniJessica D wrote: Medication: Baloxavir Marboxil 40 MG Dose (XOFLUZA) 20 (2) MG TBPK [[id number]]    Has the patient contacted their pharmacy? Yes.     (Agent: If no, request that the patient contact the pharmacy for the refill.)   Preferred Pharmacy (with phone number or street name): [facility] [address]- [place], [place] - [id number] [phone] (Phone) [phone] (Fax)  Pt states medication is on backorder and is too high. She would like to know if the provider could call in something else.   Agent: Please be advised that RX refills may take up to 3 business days. We ask that you follow-up with your pharmacy.

## 2017-10-05 NOTE — Progress Notes (Signed)
   Penny JunesBeenta Bishop  MRN: [id number] DOB: 1981/01/29  PCP: Orpah CobbKadakia, Ajay, MD  Subjective:  Pt is a 37 year old female who presents to clinic for cough x 2 days. She endorses chills, body aches, cough, runny nose, sinus pressure and sore throat.  Daughter tested positive for the flu.  She has taken otc medications to feel better.   Review of Systems  Constitutional: Positive for chills. Negative for diaphoresis, fatigue and fever.  HENT: Positive for congestion, postnasal drip, sinus pressure and sore throat. Negative for rhinorrhea and sinus pain.   Respiratory: Positive for cough. Negative for shortness of breath and wheezing.   Psychiatric/Behavioral: Positive for sleep disturbance.    Patient Active Problem List   Diagnosis Date Noted  . Hair loss 12/29/2014  . Anemia, iron deficiency 12/29/2014    Current Outpatient Medications on File Prior to Visit  Medication Sig Dispense Refill  . iron polysaccharides (NIFEREX) 150 MG capsule Take 150 mg by mouth daily.    . Multiple Vitamin (MULTIVITAMIN) tablet Take 1 tablet by mouth daily.    . Biotin 5000 MCG CAPS Take by mouth.    . meloxicam (MOBIC) 7.5 MG tablet Take 1 tablet (7.5 mg total) by mouth daily. (Patient not taking: Reported on 08/10/2017) 30 tablet 0   No current facility-administered medications on file prior to visit.     Allergies  Allergen Reactions  . Chlorhexidine Gluconate Rash     Objective:  BP 120/74 (BP Location: Left Arm, Patient Position: Sitting, Cuff Size: Normal)   Pulse 92   Temp 98.2 F (36.8 C) (Oral)   Ht 5' 0.24" (1.53 m)   Wt 126 lb 6.4 oz (57.3 kg)   SpO2 99%   BMI 24.49 kg/m   Physical Exam  Constitutional: She is oriented to person, place, and time and well-developed, well-nourished, and in no distress. No distress.  HENT:  Right Ear: Tympanic membrane normal.  Left Ear: Tympanic membrane normal.  Nose: Mucosal edema present. No rhinorrhea. Right sinus exhibits no maxillary sinus  tenderness and no frontal sinus tenderness. Left sinus exhibits no maxillary sinus tenderness and no frontal sinus tenderness.  Mouth/Throat: Oropharynx is clear and moist and mucous membranes are normal.  Cardiovascular: Normal rate, regular rhythm and normal heart sounds.  Pulmonary/Chest: Effort normal and breath sounds normal. No respiratory distress. She has no wheezes. She has no rales.  Neurological: She is alert and oriented to person, place, and time. GCS score is 15.  Skin: Skin is warm and dry.  Psychiatric: Mood, memory, affect and judgment normal.  Vitals reviewed.   Assessment and Plan :  1. Flu-like symptoms 2. Exposure to the flu - POC Influenza A&B(BINAX/QUICKVUE) - Baloxavir Marboxil 40 MG Dose (XOFLUZA) 20 (2) MG TBPK; Take 40 mg by mouth once for 1 dose.  Dispense: 2 each; Refill: 0 - Pt c/o flu-like symptoms with +exposure to the flu. Plan to treat with Xofluza. RTC in 5-7 days if no improvement. Stay well hydrated.   Marco CollieWhitney Desiree Daise, PA-C  [facility] 10/05/2017 3:47 PM

## 2017-10-05 NOTE — Patient Instructions (Addendum)
Xofluza: take 43m once.  Come back if you are not better in 5-7 days.   Thank you for coming in today. I hope you feel we met your needs.  Feel free to call PCP if you have any questions or further requests.  Please consider signing up for MyChart if you do not already have it, as this is a great way to communicate with me.  Best,  Whitney McVey, PA-C  Influenza, Adult Influenza, more commonly known as "the flu," is a viral infection that primarily affects the respiratory tract. The respiratory tract includes organs that help you breathe, such as the lungs, nose, and throat. The flu causes many common cold symptoms, as well as a high fever and body aches. The flu spreads easily from person to person (is contagious). Getting a flu shot (influenza vaccination) every year is the best way to prevent influenza. What are the causes? Influenza is caused by a virus. You can catch the virus by:  Breathing in droplets from an infected person's cough or sneeze.  Touching something that was recently contaminated with the virus and then touching your mouth, nose, or eyes.  What increases the risk? The following factors may make you more likely to get the flu:  Not cleaning your hands frequently with soap and water or alcohol-based hand sanitizer.  Having close contact with many people during cold and flu season.  Touching your mouth, eyes, or nose without washing or sanitizing your hands first.  Not drinking enough fluids or not eating a healthy diet.  Not getting enough sleep or exercise.  Being under a high amount of stress.  Not getting a yearly (annual) flu shot.  You may be at a higher risk of complications from the flu, such as a severe lung infection (pneumonia), if you:  Are over the age of 627  Are pregnant.  Have a weakened disease-fighting system (immune system). You may have a weakened immune system if you: ? Have HIV or AIDS. ? Are undergoing chemotherapy. ? Aretaking  medicines that reduce the activity of (suppress) the immune system.  Have a long-term (chronic) illness, such as heart disease, kidney disease, diabetes, or lung disease.  Have a liver disorder.  Are obese.  Have anemia.  What are the signs or symptoms? Symptoms of this condition typically last 4-10 days and may include:  Fever.  Chills.  Headache, body aches, or muscle aches.  Sore throat.  Cough.  Runny or congested nose.  Chest discomfort and cough.  Poor appetite.  Weakness or tiredness (fatigue).  Dizziness.  Nausea or vomiting.  How is this diagnosed? This condition may be diagnosed based on your medical history and a physical exam. Your health care provider may do a nose or throat swab test to confirm the diagnosis. How is this treated? If influenza is detected early, you can be treated with antiviral medicine that can reduce the length of your illness and the severity of your symptoms. This medicine may be given by mouth (orally) or through an IV tube that is inserted in one of your veins. The goal of treatment is to relieve symptoms by taking care of yourself at home. This may include taking over-the-counter medicines, drinking plenty of fluids, and adding humidity to the air in your home. In some cases, influenza goes away on its own. Severe influenza or complications from influenza may be treated in a hospital. Follow these instructions at home:  Take over-the-counter and prescription medicines only as told by  your health care provider.  Use a cool mist humidifier to add humidity to the air in your home. This can make breathing easier.  Rest as needed.  Drink enough fluid to keep your urine clear or pale yellow.  Cover your mouth and nose when you cough or sneeze.  Wash your hands with soap and water often, especially after you cough or sneeze. If soap and water are not available, use hand sanitizer.  Stay home from work or school as told by your  health care provider. Unless you are visiting your health care provider, try to avoid leaving home until your fever has been gone for 24 hours without the use of medicine.  Keep all follow-up visits as told by your health care provider. This is important. How is this prevented?  Getting an annual flu shot is the best way to avoid getting the flu. You may get the flu shot in late summer, fall, or winter. Ask your health care provider when you should get your flu shot.  Wash your hands often or use hand sanitizer often.  Avoid contact with people who are sick during cold and flu season.  Eat a healthy diet, drink plenty of fluids, get enough sleep, and exercise regularly. Contact a health care provider if:  You develop new symptoms.  You have: ? Chest pain. ? Diarrhea. ? A fever.  Your cough gets worse.  You produce more mucus.  You feel nauseous or you vomit. Get help right away if:  You develop shortness of breath or difficulty breathing.  Your skin or nails turn a bluish color.  You have severe pain or stiffness in your neck.  You develop a sudden headache or sudden pain in your face or ear.  You cannot stop vomiting. This information is not intended to replace advice given to you by your health care provider. Make sure you discuss any questions you have with your health care provider. Document Released: 08/19/2000 Document Revised: 01/28/2016 Document Reviewed: 06/16/2015 Elsevier Interactive Patient Education  2017 [employer].    IF you received an x-ray today, you will receive an invoice from [facility]. Please contact [facility] at [phone] with questions or concerns regarding your invoice.   IF you received labwork today, you will receive an invoice from [facility]. Please contact [facility] at [phone] with questions or concerns regarding your invoice.   Our billing staff will not be able to assist you with questions regarding bills  from these companies.  You will be contacted with the lab results as soon as they are available. The fastest way to get your results is to activate your My Chart account. Instructions are located on the last page of this paperwork. If you have not heard from [place] regarding the results in 2 weeks, please contact this office.

## 2017-10-06 ENCOUNTER — Other Ambulatory Visit: Payer: Self-pay | Admitting: Physician Assistant

## 2017-10-06 DIAGNOSIS — Z20828 Contact with and (suspected) exposure to other viral communicable diseases: Secondary | ICD-10-CM

## 2017-10-06 MED ORDER — OSELTAMIVIR PHOSPHATE 75 MG PO CAPS: 75.0000 mg | ORAL_CAPSULE | Freq: Two times a day (BID) | ORAL | 0 refills | Status: DC

## 2017-10-06 NOTE — Telephone Encounter (Signed)
Pt notified and verbalized understanding.

## 2017-10-06 NOTE — Telephone Encounter (Signed)
Pt calling again to request medicine for the flu.

## 2017-10-06 NOTE — Telephone Encounter (Signed)
Pt called in to follow up on request. Pt says that she was Dx with the flu.   Please assist further.   CB: [phone]

## 2017-10-06 NOTE — Telephone Encounter (Signed)
Please call pt and let her know she can pick up medication at her pharmacy.

## 2017-10-11 ENCOUNTER — Telehealth: Payer: Self-pay | Admitting: *Deleted

## 2017-10-11 NOTE — Telephone Encounter (Signed)
Per fax from [facility] medication Xofluza 20 mg is too expensive and pt would like to change to something less expensive. I spoke with the patient and she stated that she did take the medication.

## 2017-12-16 ENCOUNTER — Other Ambulatory Visit: Payer: Self-pay

## 2017-12-16 ENCOUNTER — Ambulatory Visit (INDEPENDENT_AMBULATORY_CARE_PROVIDER_SITE_OTHER): Payer: Managed Care, Other (non HMO) | Admitting: Family Medicine

## 2017-12-16 ENCOUNTER — Encounter: Payer: Self-pay | Admitting: Family Medicine

## 2017-12-16 VITALS — BP 102/68 | HR 75 | Temp 98.0°F | Wt 130.6 lb

## 2017-12-16 DIAGNOSIS — R7303 Prediabetes: Secondary | ICD-10-CM

## 2017-12-16 DIAGNOSIS — L819 Disorder of pigmentation, unspecified: Secondary | ICD-10-CM

## 2017-12-16 DIAGNOSIS — E559 Vitamin D deficiency, unspecified: Secondary | ICD-10-CM

## 2017-12-16 DIAGNOSIS — Z5181 Encounter for therapeutic drug level monitoring: Secondary | ICD-10-CM

## 2017-12-16 DIAGNOSIS — R21 Rash and other nonspecific skin eruption: Secondary | ICD-10-CM

## 2017-12-16 DIAGNOSIS — E569 Vitamin deficiency, unspecified: Secondary | ICD-10-CM

## 2017-12-16 DIAGNOSIS — D509 Iron deficiency anemia, unspecified: Secondary | ICD-10-CM

## 2017-12-16 NOTE — Progress Notes (Addendum)
Subjective:  By signing my name below, I, [name], attest that this documentation has been prepared under the direction and in the presence of [name], MD. Electronically Signed: [name], Scribe. 12/16/2017 , 12:51 PM .  Patient was seen in Room 2 .   Patient ID: Penny Bishop, female    DOB: 14-Jul-1981, 37 y.o.   MRN: [id number] Chief Complaint  Patient presents with  . Skin Problem    pt states she has noticed little red dots on her legs and arms x 2 months. Pt states  spots are not itchy just red    HPI Zamirah Denny is a 37 y.o. female who presents to Primary Care at [facility] complaining of non-itchy rash that was initially noticed in 2 months ago. She initially noticed little red dots over her bilateral forearms and into her upper arms bilaterally. She's also noticed similar little red dots in her lower legs and ankles about 2-3 weeks ago. She denies any new recent medications, with newest medication Vitamin D and omega-3 in December. She usually wears long sleeves and rarely exposing her forearms to the sun. She denies any new exposures at work. She usually showers with Olay wash, with only using Jergen's lotion occasionally. She's been on iron supplements for more than a year now. Her mother has had some red dots, but started in her 51s.   She also mentions surgery done about a year ago (June 2018), and has an 1-inch area around incision.   Past Medical History:  Diagnosis Date  . Hepatitis B ~ 2001  . Iron deficiency anemia   . Maternal fever during labor 03/20/2011  . Pneumonia 08/20/2014  . Pneumonia involving right lung 08/20/2014  . Status post repeat low transverse cesarean section 03/23/2011   Past Surgical History:  Procedure Laterality Date  . CESAREAN SECTION  04/05/2009;   . CESAREAN SECTION  03/20/2011   Procedure: CESAREAN SECTION;  Surgeon: [name], MD;  Location: [facility];  Service: Gynecology;  Laterality: N/A;  cord ph 7.18, fundal massage by A.  [employer] fund[employer]  . COSMETIC SURGERY     Prior to Admission medications   Medication Sig Start Date End Date Taking? Authorizing Provider  Biotin 5000 MCG CAPS Take by mouth.    [provider]  iron polysaccharides (NIFEREX) 150 MG capsule Take 150 mg by mouth daily.    [provider]  meloxicam (MOBIC) 7.5 MG tablet Take 1 tablet (7.5 mg total) by mouth daily. Patient not taking: Reported on 08/10/2017 02/26/16   Trena Platt D, PA  Multiple Vitamin (MULTIVITAMIN) tablet Take 1 tablet by mouth daily.    [provider]  oseltamivir (TAMIFLU) 75 MG capsule Take 1 capsule (75 mg total) by mouth 2 (two) times daily. 10/06/17   McVey, Madelaine Bhat, PA-C   Allergies  Allergen Reactions  . Chlorhexidine Gluconate Rash   Family History  Problem Relation Age of Onset  . Diabetes Mother   . Hypertension Mother   . Hypertension Father   . Diabetes Maternal Grandmother   . Diabetes Maternal Grandfather   . Hypertension Paternal Grandmother   . COPD Paternal Grandfather    Social History   Socioeconomic History  . Marital status: Married    Spouse name: Not on file  . Number of children: Not on file  . Years of education: Not on file  . Highest education level: Not on file  Occupational History  . Not on file  Social Needs  .  Financial resource strain: Not on file  . Food insecurity:    Worry: Not on file    Inability: Not on file  . Transportation needs:    Medical: Not on file    Non-medical: Not on file  Tobacco Use  . Smoking status: Never Smoker  . Smokeless tobacco: Never Used  Substance and Sexual Activity  . Alcohol use: No  . Drug use: No  . Sexual activity: Yes  Lifestyle  . Physical activity:    Days per week: Not on file    Minutes per session: Not on file  . Stress: Not on file  Relationships  . Social connections:    Talks on phone: Not on file    Gets together: Not on file    Attends religious service: Not on file    Active  member of club or organization: Not on file    Attends meetings of clubs or organizations: Not on file    Relationship status: Not on file  Other Topics Concern  . Not on file  Social History Narrative  . Not on file   Depression screen [facility] 2/9 12/16/2017 10/05/2017 08/10/2017 02/29/2016 02/26/2016  Decreased Interest 0 0 0 0 0  Down, Depressed, Hopeless 0 0 0 0 0  PHQ - 2 Score 0 0 0 0 0    Review of Systems  Skin: Positive for rash.       Objective:   Physical Exam  Constitutional: She is oriented to person, place, and time. She appears well-developed and well-nourished. No distress.  HENT:  Head: Normocephalic and atraumatic.  Eyes: Pupils are equal, round, and reactive to light. EOM are normal.  Neck: Neck supple.  Cardiovascular: Normal rate.  Pulmonary/Chest: Effort normal. No respiratory distress.  Musculoskeletal: Normal range of motion.  Neurological: She is alert and oriented to person, place, and time.  Skin: Skin is warm and dry. Rash (non itchy) noted.  Upper extremities: non-blanching macular hyperpigmented spots that are pinpoint, ranging from 1/2 mm to 2mm  Psychiatric: She has a normal mood and affect. Her behavior is normal.  Nursing note and vitals reviewed.   BP 102/68   Pulse 75   Temp 98 F (36.7 C) (Oral)   Wt 130 lb 9.6 oz (59.2 kg)   SpO2 99%   BMI 25.31 kg/m             Assessment & Plan:   1. Rash and nonspecific skin eruption - see below  2. Hyperpigmented skin lesion  -  These look like melanocytic nevi or freckles to me as are on typically sun exposed areas but pt adament.  Works for [employer] so requests detailed lab work-up done as she is concerned it could be from her supplements. Seen by derm Dr. Betsy Coder at [facility] sev yr prior - refer back.  3. Iron deficiency anemia, unspecified iron deficiency anemia type   4. Medication monitoring encounter   5. Prediabetes   6.      Vitamin D deficiency,  unspecified - pt reports she has been instructed to take vit D so assume she must have been deficient prior - certainly many risk factors for deficiency with medium complexion and adament denial of any sun exposure to exposed skin - check level. 7.      Multiple vitamin deficiency  Orders Placed This Encounter  Procedures  . CBC with Differential/Platelet  . Comprehensive metabolic panel  . VITAMIN D 25 Hydroxy (Vit-D Deficiency, Fractures)  .  Vitamin B12  . Ferritin  . Sedimentation Rate  . Uric A+ANA+RA Qn+CRP+ASO  . Iron and TIBC([facility])  . Hemoglobin A1c  . Biotinidase level  . Status Report  . Vitamin B7  . Specimen status report  . Ambulatory referral to Dermatology    Referral Priority:   Routine    Referral Type:   Consultation    Referral Reason:   Specialty Services Required    Requested Specialty:   Dermatology    Number of Visits Requested:   1      I personally performed the services described in this documentation, which was scribed in my presence. The recorded information has been reviewed and considered, and addended by me as needed.   Norberto SorensonEva Aliena Ghrist, M.D.  Primary Care at [facility]  [place] [address] [place], [place] [address] [phone] phone [phone] fax  01/10/18 11:53 PM

## 2017-12-16 NOTE — Patient Instructions (Addendum)
If you log in through MyChart (see instructions in here) you will have access to your lab results as soon as I see them which will come over.  You will receive an email as soon as your lab results are available. If you choose not to do this, someone will call you or you will receive a letter in the mail in 1-2 weeks.    IF you received an x-ray today, you will receive an invoice from [facility]. Please contact [facility] at [phone] with questions or concerns regarding your invoice.   IF you received labwork today, you will receive an invoice from [facility]. Please contact [facility] at [phone] with questions or concerns regarding your invoice.   Our billing staff will not be able to assist you with questions regarding bills from these companies.  You will be contacted with the lab results as soon as they are available. The fastest way to get your results is to activate your My Chart account. Instructions are located on the last page of this paperwork. If you have not heard from us regarding the results in 2 weeks, please contact this office.

## 2017-12-18 LAB — SEDIMENTATION RATE: SED RATE: 20 mm/h (ref 0–32)

## 2017-12-18 LAB — URIC A+ANA+RA QN+CRP+ASO
ASO: 89 IU/mL (ref 0.0–200.0)
Anti Nuclear Antibody(ANA): NEGATIVE
CRP: 17.3 mg/L — ABNORMAL HIGH (ref 0.0–4.9)
Rhuematoid fact SerPl-aCnc: <10 IU/mL (ref 0.0–13.9)
Uric Acid: 3.6 mg/dL (ref 2.5–7.1)

## 2017-12-18 LAB — VITAMIN B12: VITAMIN B 12: 946 pg/mL (ref 232–1245)

## 2017-12-18 LAB — COMPREHENSIVE METABOLIC PANEL
ALBUMIN: 4.4 g/dL (ref 3.5–5.5)
ALK PHOS: 89 IU/L (ref 39–117)
ALT: 28 IU/L (ref 0–32)
AST: 22 IU/L (ref 0–40)
Albumin/Globulin Ratio: 1.6 (ref 1.2–2.2)
BUN/Creatinine Ratio: 16 (ref 9–23)
BUN: 10 mg/dL (ref 6–20)
Bilirubin Total: 0.2 mg/dL (ref 0.0–1.2)
CO2: 22 mmol/L (ref 20–29)
CREATININE: 0.61 mg/dL (ref 0.57–1.00)
Calcium: 9.3 mg/dL (ref 8.7–10.2)
Chloride: 102 mmol/L (ref 96–106)
GFR calc Af Amer: 135 mL/min/{1.73_m2} (ref >59)
GFR calc non Af Amer: 117 mL/min/{1.73_m2} (ref >59)
GLUCOSE: 78 mg/dL (ref 65–99)
Globulin, Total: 2.7 g/dL (ref 1.5–4.5)
Potassium: 4.4 mmol/L (ref 3.5–5.2)
Sodium: 138 mmol/L (ref 134–144)
Total Protein: 7.1 g/dL (ref 6.0–8.5)

## 2017-12-18 LAB — CBC WITH DIFFERENTIAL/PLATELET
BASOS: 0 %
Basophils Absolute: 0 10*3/uL (ref 0.0–0.2)
EOS (ABSOLUTE): 0.1 10*3/uL (ref 0.0–0.4)
Eos: 1 %
HEMOGLOBIN: 12.8 g/dL (ref 11.1–15.9)
Hematocrit: 39.3 % (ref 34.0–46.6)
IMMATURE GRANS (ABS): 0 10*3/uL (ref 0.0–0.1)
IMMATURE GRANULOCYTES: 0 %
Lymphocytes Absolute: 2.5 10*3/uL (ref 0.7–3.1)
Lymphs: 26 %
MCH: 28.1 pg (ref 26.6–33.0)
MCHC: 32.6 g/dL (ref 31.5–35.7)
MCV: 86 fL (ref 79–97)
MONOCYTES: 5 %
Monocytes Absolute: 0.4 10*3/uL (ref 0.1–0.9)
NEUTROS PCT: 68 %
Neutrophils Absolute: 6.8 10*3/uL (ref 1.4–7.0)
Platelets: 216 10*3/uL (ref 150–379)
RBC: 4.55 x10E6/uL (ref 3.77–5.28)
RDW: 14.2 % (ref 12.3–15.4)
WBC: 9.9 10*3/uL (ref 3.4–10.8)

## 2017-12-18 LAB — HEMOGLOBIN A1C
ESTIMATED AVERAGE GLUCOSE: 111 mg/dL
Hgb A1c MFr Bld: 5.5 % (ref 4.8–5.6)

## 2017-12-18 LAB — FERRITIN: FERRITIN: 56 ng/mL (ref 15–150)

## 2017-12-18 LAB — IRON AND TIBC
IRON SATURATION: 12 % — AB (ref 15–55)
IRON: 43 ug/dL (ref 27–159)
Total Iron Binding Capacity: 349 ug/dL (ref 250–450)
UIBC: 306 ug/dL (ref 131–425)

## 2017-12-18 LAB — VITAMIN D 25 HYDROXY (VIT D DEFICIENCY, FRACTURES): Vit D, 25-Hydroxy: 34.9 ng/mL (ref 30.0–100.0)

## 2017-12-19 ENCOUNTER — Telehealth: Payer: Self-pay | Admitting: Family Medicine

## 2017-12-19 NOTE — Telephone Encounter (Signed)
Please advise 

## 2017-12-19 NOTE — Telephone Encounter (Signed)
Yes - B7 = biotin

## 2017-12-19 NOTE — Telephone Encounter (Signed)
Copied from CRM [phone]#[id number]. Topic: Quick Communication - See Telephone Encounter >> Dec 19, 2017 10:21 AM Cipriano BunkerLambe, Annette S wrote: CRM for notification.   [place] from [facility] is needing clarification on order - Bio - 10 , she is asking if you would want a Vitamin B 7 level?   See Telephone encounter for: 12/19/17.

## 2017-12-21 LAB — BIOTINIDASE LEVEL

## 2017-12-21 LAB — STATUS REPORT

## 2017-12-24 LAB — SPECIMEN STATUS REPORT

## 2017-12-24 LAB — VITAMIN B7: Vitamin B7: 3.91 ng/mL — ABNORMAL HIGH (ref 0.05–0.83)

## 2018-09-04 ENCOUNTER — Encounter

## 2018-09-12 ENCOUNTER — Telehealth: Payer: Self-pay | Admitting: Family Medicine

## 2018-09-12 NOTE — Telephone Encounter (Signed)
MyChart message sent to pt about their appointmnent on 09/26/18 with Dr Clelia Croft

## 2018-09-26 ENCOUNTER — Encounter: Payer: Managed Care, Other (non HMO) | Admitting: Family Medicine

## 2018-09-26 ENCOUNTER — Encounter

## 2018-10-29 ENCOUNTER — Ambulatory Visit (INDEPENDENT_AMBULATORY_CARE_PROVIDER_SITE_OTHER): Payer: Managed Care, Other (non HMO) | Admitting: Emergency Medicine

## 2018-10-29 ENCOUNTER — Other Ambulatory Visit: Payer: Self-pay

## 2018-10-29 ENCOUNTER — Encounter: Payer: Self-pay | Admitting: Emergency Medicine

## 2018-10-29 VITALS — BP 93/60 | HR 83 | Temp 98.5°F | Resp 16 | Ht 60.5 in | Wt 130.0 lb

## 2018-10-29 DIAGNOSIS — Z130 Encounter for screening for diseases of the blood and blood-forming organs and certain disorders involving the immune mechanism: Secondary | ICD-10-CM

## 2018-10-29 DIAGNOSIS — Z1329 Encounter for screening for other suspected endocrine disorder: Secondary | ICD-10-CM | POA: Diagnosis not present

## 2018-10-29 DIAGNOSIS — Z13220 Encounter for screening for lipoid disorders: Secondary | ICD-10-CM | POA: Diagnosis not present

## 2018-10-29 DIAGNOSIS — Z13228 Encounter for screening for other metabolic disorders: Secondary | ICD-10-CM

## 2018-10-29 DIAGNOSIS — Z0000 Encounter for general adult medical examination without abnormal findings: Secondary | ICD-10-CM | POA: Diagnosis not present

## 2018-10-29 NOTE — Patient Instructions (Addendum)
   If you have lab work done today you will be contacted with your lab results within the next 2 weeks.  If you have not heard from us then please contact us. The fastest way to get your results is to register for My Chart.   IF you received an x-ray today, you will receive an invoice from [facility]. Please contact [facility] at [phone] with questions or concerns regarding your invoice.   IF you received labwork today, you will receive an invoice from [facility]. Please contact [facility] at [phone] with questions or concerns regarding your invoice.   Our billing staff will not be able to assist you with questions regarding bills from these companies.  You will be contacted with the lab results as soon as they are available. The fastest way to get your results is to activate your My Chart account. Instructions are located on the last page of this paperwork. If you have not heard from us regarding the results in 2 weeks, please contact this office.    Health Maintenance, Female Adopting a healthy lifestyle and getting preventive care can go a long way to promote health and wellness. Talk with your health care provider about what schedule of regular examinations is right for you. This is a good chance for you to check in with your provider about disease prevention and staying healthy. In between checkups, there are plenty of things you can do on your own. Experts have done a lot of research about which lifestyle changes and preventive measures are most likely to keep you healthy. Ask your health care provider for more information. Weight and diet Eat a healthy diet  Be sure to include plenty of vegetables, fruits, low-fat dairy products, and lean protein.  Do not eat a lot of foods high in solid fats, added sugars, or salt.  Get regular exercise. This is one of the most important things you can do for your health. ? Most adults should exercise for at least 150  minutes each week. The exercise should increase your heart rate and make you sweat (moderate-intensity exercise). ? Most adults should also do strengthening exercises at least twice a week. This is in addition to the moderate-intensity exercise. Maintain a healthy weight  Body mass index (BMI) is a measurement that can be used to identify possible weight problems. It estimates body fat based on height and weight. Your health care provider can help determine your BMI and help you achieve or maintain a healthy weight.  For females 20 years of age and older: ? A BMI below 18.5 is considered underweight. ? A BMI of 18.5 to 24.9 is normal. ? A BMI of 25 to 29.9 is considered overweight. ? A BMI of 30 and above is considered obese. Watch levels of cholesterol and blood lipids  You should start having your blood tested for lipids and cholesterol at 37 years of age, then have this test every 5 years.  You may need to have your cholesterol levels checked more often if: ? Your lipid or cholesterol levels are high. ? You are older than 37 years of age. ? You are at high risk for heart disease. Cancer screening Lung Cancer  Lung cancer screening is recommended for adults 55-80 years old who are at high risk for lung cancer because of a history of smoking.  A yearly low-dose CT scan of the lungs is recommended for people who: ? Currently smoke. ? Have quit within the past 15   years. ? Have at least a 30-pack-year history of smoking. A pack year is smoking an average of one pack of cigarettes a day for 1 year.  Yearly screening should continue until it has been 15 years since you quit.  Yearly screening should stop if you develop a health problem that would prevent you from having lung cancer treatment. Breast Cancer  Practice breast self-awareness. This means understanding how your breasts normally appear and feel.  It also means doing regular breast self-exams. Let your health care provider  know about any changes, no matter how small.  If you are in your 20s or 30s, you should have a clinical breast exam (CBE) by a health care provider every 1-3 years as part of a regular health exam.  If you are 40 or older, have a CBE every year. Also consider having a breast X-ray (mammogram) every year.  If you have a family history of breast cancer, talk to your health care provider about genetic screening.  If you are at high risk for breast cancer, talk to your health care provider about having an MRI and a mammogram every year.  Breast cancer gene (BRCA) assessment is recommended for women who have family members with BRCA-related cancers. BRCA-related cancers include: ? Breast. ? Ovarian. ? Tubal. ? Peritoneal cancers.  Results of the assessment will determine the need for genetic counseling and BRCA1 and BRCA2 testing. Cervical Cancer Your health care provider may recommend that you be screened regularly for cancer of the pelvic organs (ovaries, uterus, and vagina). This screening involves a pelvic examination, including checking for microscopic changes to the surface of your cervix (Pap test). You may be encouraged to have this screening done every 3 years, beginning at age 21.  For women ages 30-65, health care providers may recommend pelvic exams and Pap testing every 3 years, or they may recommend the Pap and pelvic exam, combined with testing for human papilloma virus (HPV), every 5 years. Some types of HPV increase your risk of cervical cancer. Testing for HPV may also be done on women of any age with unclear Pap test results.  Other health care providers may not recommend any screening for nonpregnant women who are considered low risk for pelvic cancer and who do not have symptoms. Ask your health care provider if a screening pelvic exam is right for you.  If you have had past treatment for cervical cancer or a condition that could lead to cancer, you need Pap tests and  screening for cancer for at least 20 years after your treatment. If Pap tests have been discontinued, your risk factors (such as having a new sexual partner) need to be reassessed to determine if screening should resume. Some women have medical problems that increase the chance of getting cervical cancer. In these cases, your health care provider may recommend more frequent screening and Pap tests. Colorectal Cancer  This type of cancer can be detected and often prevented.  Routine colorectal cancer screening usually begins at 37 years of age and continues through 37 years of age.  Your health care provider may recommend screening at an earlier age if you have risk factors for colon cancer.  Your health care provider may also recommend using home test kits to check for hidden blood in the stool.  A small camera at the end of a tube can be used to examine your colon directly (sigmoidoscopy or colonoscopy). This is done to check for the earliest forms of colorectal cancer.    Routine screening usually begins at age 50.  Direct examination of the colon should be repeated every 5-10 years through 37 years of age. However, you may need to be screened more often if early forms of precancerous polyps or small growths are found. Skin Cancer  Check your skin from head to toe regularly.  Tell your health care provider about any new moles or changes in moles, especially if there is a change in a mole's shape or color.  Also tell your health care provider if you have a mole that is larger than the size of a pencil eraser.  Always use sunscreen. Apply sunscreen liberally and repeatedly throughout the day.  Protect yourself by wearing long sleeves, pants, a wide-brimmed hat, and sunglasses whenever you are outside. Heart disease, diabetes, and high blood pressure  High blood pressure causes heart disease and increases the risk of stroke. High blood pressure is more likely to develop in: ? People who  have blood pressure in the high end of the normal range (130-139/85-89 mm Hg). ? People who are overweight or obese. ? People who are African American.  If you are 18-39 years of age, have your blood pressure checked every 3-5 years. If you are 40 years of age or older, have your blood pressure checked every year. You should have your blood pressure measured twice-once when you are at a hospital or clinic, and once when you are not at a hospital or clinic. Record the average of the two measurements. To check your blood pressure when you are not at a hospital or clinic, you can use: ? An automated blood pressure machine at a pharmacy. ? A home blood pressure monitor.  If you are between 55 years and 79 years old, ask your health care provider if you should take aspirin to prevent strokes.  Have regular diabetes screenings. This involves taking a blood sample to check your fasting blood sugar level. ? If you are at a normal weight and have a low risk for diabetes, have this test once every three years after 37 years of age. ? If you are overweight and have a high risk for diabetes, consider being tested at a younger age or more often. Preventing infection Hepatitis B  If you have a higher risk for hepatitis B, you should be screened for this virus. You are considered at high risk for hepatitis B if: ? You were born in a country where hepatitis B is common. Ask your health care provider which countries are considered high risk. ? Your parents were born in a high-risk country, and you have not been immunized against hepatitis B (hepatitis B vaccine). ? You have HIV or AIDS. ? You use needles to inject street drugs. ? You live with someone who has hepatitis B. ? You have had sex with someone who has hepatitis B. ? You get hemodialysis treatment. ? You take certain medicines for conditions, including cancer, organ transplantation, and autoimmune conditions. Hepatitis C  Blood testing is  recommended for: ? Everyone born from 1945 through 1965. ? Anyone with known risk factors for hepatitis C. Sexually transmitted infections (STIs)  You should be screened for sexually transmitted infections (STIs) including gonorrhea and chlamydia if: ? You are sexually active and are younger than 37 years of age. ? You are older than 37 years of age and your health care provider tells you that you are at risk for this type of infection. ? Your sexual activity has changed since you   were last screened and you are at an increased risk for chlamydia or gonorrhea. Ask your health care provider if you are at risk.  If you do not have HIV, but are at risk, it may be recommended that you take a prescription medicine daily to prevent HIV infection. This is called pre-exposure prophylaxis (PrEP). You are considered at risk if: ? You are sexually active and do not regularly use condoms or know the HIV status of your partner(s). ? You take drugs by injection. ? You are sexually active with a partner who has HIV. Talk with your health care provider about whether you are at high risk of being infected with HIV. If you choose to begin PrEP, you should first be tested for HIV. You should then be tested every 3 months for as long as you are taking PrEP. Pregnancy  If you are premenopausal and you may become pregnant, ask your health care provider about preconception counseling.  If you may become pregnant, take 400 to 800 micrograms (mcg) of folic acid every day.  If you want to prevent pregnancy, talk to your health care provider about birth control (contraception). Osteoporosis and menopause  Osteoporosis is a disease in which the bones lose minerals and strength with aging. This can result in serious bone fractures. Your risk for osteoporosis can be identified using a bone density scan.  If you are 65 years of age or older, or if you are at risk for osteoporosis and fractures, ask your health care  provider if you should be screened.  Ask your health care provider whether you should take a calcium or vitamin D supplement to lower your risk for osteoporosis.  Menopause may have certain physical symptoms and risks.  Hormone replacement therapy may reduce some of these symptoms and risks. Talk to your health care provider about whether hormone replacement therapy is right for you. Follow these instructions at home:  Schedule regular health, dental, and eye exams.  Stay current with your immunizations.  Do not use any tobacco products including cigarettes, chewing tobacco, or electronic cigarettes.  If you are pregnant, do not drink alcohol.  If you are breastfeeding, limit how much and how often you drink alcohol.  Limit alcohol intake to no more than 1 drink per day for nonpregnant women. One drink equals 12 ounces of beer, 5 ounces of wine, or 1 ounces of hard liquor.  Do not use street drugs.  Do not share needles.  Ask your health care provider for help if you need support or information about quitting drugs.  Tell your health care provider if you often feel depressed.  Tell your health care provider if you have ever been abused or do not feel safe at home. This information is not intended to replace advice given to you by your health care provider. Make sure you discuss any questions you have with your health care provider. Document Released: 03/07/2011 Document Revised: 01/28/2016 Document Reviewed: 05/26/2015 [employer] Interactive Patient Education  2019 [employer]  

## 2018-10-29 NOTE — Progress Notes (Signed)
Penny Bishop 38 y.o.   Chief Complaint  Patient presents with  . Annual Exam    HISTORY OF PRESENT ILLNESS: This is a 38 y.o. female here for her annual exam.  No complaints or medical concerns today. No chronic medical problems on no chronic medications.  Non-smoker and no EtOH abuser. Healthy lifestyle.  Married mother of 2.  Works second shift.  Good nutrition.  Adequate sleep.  Physically active.  HPI   Prior to Admission medications   Medication Sig Start Date End Date Taking? Authorizing Provider  B Complex Vitamins (VITAMIN B COMPLEX) TABS Take by mouth daily.   Yes [provider]  Biotin 5000 MCG CAPS Take by mouth.   Yes [provider]  iron polysaccharides (NIFEREX) 150 MG capsule Take 150 mg by mouth daily.   Yes [provider]  Multiple Vitamin (MULTIVITAMIN) tablet Take 1 tablet by mouth daily.   Yes [provider]  VITAMIN D-VITAMIN K PO Take by mouth daily.   Yes [provider]    Allergies  Allergen Reactions  . Chlorhexidine Gluconate Rash    Patient Active Problem List   Diagnosis Date Noted  . Hair loss 12/29/2014  . Anemia, iron deficiency 12/29/2014    Past Medical History:  Diagnosis Date  . Hepatitis B ~ 2001  . Iron deficiency anemia   . Maternal fever during labor 03/20/2011  . Pneumonia 08/20/2014  . Pneumonia involving right lung 08/20/2014  . Status post repeat low transverse cesarean section 03/23/2011    Past Surgical History:  Procedure Laterality Date  . CESAREAN SECTION  04/05/2009;   . CESAREAN SECTION  03/20/2011   Procedure: CESAREAN SECTION;  Surgeon: Frederico Hamman, MD;  Location: [facility];  Service: Gynecology;  Laterality: N/A;  cord ph 7.18, fundal massage by [employer], sports  . COSMETIC SURGERY      Social History   Socioeconomic History  . Marital status: Married    Spouse name: Not on file  . Number of children: Not on file  . Years of education: Not on file  . Highest  education level: Not on file  Occupational History  . Not on file  Social Needs  . Financial resource strain: Not on file  . Food insecurity:    Worry: Not on file    Inability: Not on file  . Transportation needs:    Medical: Not on file    Non-medical: Not on file  Tobacco Use  . Smoking status: Never Smoker  . Smokeless tobacco: Never Used  Substance and Sexual Activity  . Alcohol use: No  . Drug use: No  . Sexual activity: Yes  Lifestyle  . Physical activity:    Days per week: Not on file    Minutes per session: Not on file  . Stress: Not on file  Relationships  . Social connections:    Talks on phone: Not on file    Gets together: Not on file    Attends religious service: Not on file    Active member of club or organization: Not on file    Attends meetings of clubs or organizations: Not on file    Relationship status: Not on file  . Intimate partner violence:    Fear of current or ex partner: Not on file    Emotionally abused: Not on file    Physically abused: Not on file    Forced sexual activity: Not on file  Other Topics Concern  .  Not on file  Social History Narrative  . Not on file    Family History  Problem Relation Age of Onset  . Diabetes Mother   . Hypertension Mother   . Hypertension Father   . Diabetes Maternal Grandmother   . Diabetes Maternal Grandfather   . Hypertension Paternal Grandmother   . COPD Paternal Grandfather      Review of Systems  Constitutional: Negative.  Negative for chills, fever and weight loss.  HENT: Negative.  Negative for hearing loss.   Eyes: Negative.  Negative for blurred vision and double vision.  Respiratory: Negative.  Negative for cough, sputum production and shortness of breath.   Cardiovascular: Negative.  Negative for chest pain and palpitations.  Gastrointestinal: Negative.  Negative for abdominal pain, diarrhea, nausea and vomiting.  Genitourinary: Negative.  Negative for dysuria and hematuria.   Musculoskeletal: Negative.  Negative for back pain, myalgias and neck pain.  Skin: Negative.  Negative for rash.  Neurological: Negative.  Negative for dizziness and headaches.  All other systems reviewed and are negative.  Vitals:   10/29/18 1523  BP: 93/60  Pulse: 83  Resp: 16  Temp: 98.5 F (36.9 C)  SpO2: 98%     Physical Exam Vitals signs reviewed.  Constitutional:      Appearance: Normal appearance.  HENT:     Head: Normocephalic and atraumatic.     Nose: Nose normal.     Mouth/Throat:     Mouth: Mucous membranes are moist.     Pharynx: Oropharynx is clear.  Eyes:     Extraocular Movements: Extraocular movements intact.     Conjunctiva/sclera: Conjunctivae normal.     Pupils: Pupils are equal, round, and reactive to light.  Neck:     Musculoskeletal: Normal range of motion and neck supple.     Vascular: No carotid bruit.  Cardiovascular:     Rate and Rhythm: Normal rate and regular rhythm.     Pulses: Normal pulses.     Heart sounds: Normal heart sounds.  Pulmonary:     Effort: Pulmonary effort is normal.     Breath sounds: Normal breath sounds.  Abdominal:     General: Bowel sounds are normal. There is no distension.     Palpations: Abdomen is soft. There is no mass.     Tenderness: There is no abdominal tenderness. There is no guarding.  Musculoskeletal: Normal range of motion.        General: No swelling.     Right lower leg: No edema.  Lymphadenopathy:     Cervical: No cervical adenopathy.  Skin:    General: Skin is warm and dry.     Capillary Refill: Capillary refill takes less than 2 seconds.  Neurological:     General: No focal deficit present.     Mental Status: She is alert and oriented to person, place, and time.      ASSESSMENT & PLAN: Penny Bishop was seen today for annual exam.  Diagnoses and all orders for this visit:  Routine general medical examination at a health care facility -     CBC with Differential/Platelet -     Comprehensive  metabolic panel -     Lipid panel -     TSH  Screening for deficiency anemia -     CBC with Differential/Platelet  Screening for lipoid disorders -     Lipid panel  Screening for endocrine, metabolic and immunity disorder -     Comprehensive metabolic panel -  Lipid panel -     TSH   Patient Instructions       If you have lab work done today you will be contacted with your lab results within the next 2 weeks.  If you have not heard from [place] then please contact us. The fastest way to get your results is to register for My Chart.   IF you received an x-ray today, you will receive an invoice from [facility]. Please contact [facility] at [phone] with questions or concerns regarding your invoice.   IF you received labwork today, you will receive an invoice from [facility]. Please contact [facility] at [phone] with questions or concerns regarding your invoice.   Our billing staff will not be able to assist you with questions regarding bills from these companies.  You will be contacted with the lab results as soon as they are available. The fastest way to get your results is to activate your My Chart account. Instructions are located on the last page of this paperwork. If you have not heard from [place] regarding the results in 2 weeks, please contact this office.     Health Maintenance, Female Adopting a healthy lifestyle and getting preventive care can go a long way to promote health and wellness. Talk with your health care provider about what schedule of regular examinations is right for you. This is a good chance for you to check in with your provider about disease prevention and staying healthy. In between checkups, there are plenty of things you can do on your own. Experts have done a lot of research about which lifestyle changes and preventive measures are most likely to keep you healthy. Ask your health care provider for more information. Weight and  diet Eat a healthy diet  Be sure to include plenty of vegetables, fruits, low-fat dairy products, and lean protein.  Do not eat a lot of foods high in solid fats, added sugars, or salt.  Get regular exercise. This is one of the most important things you can do for your health. ? Most adults should exercise for at least 150 minutes each week. The exercise should increase your heart rate and make you sweat (moderate-intensity exercise). ? Most adults should also do strengthening exercises at least twice a week. This is in addition to the moderate-intensity exercise. Maintain a healthy weight  Body mass index (BMI) is a measurement that can be used to identify possible weight problems. It estimates body fat based on height and weight. Your health care provider can help determine your BMI and help you achieve or maintain a healthy weight.  For females 43 years of age and older: ? A BMI below 18.5 is considered underweight. ? A BMI of 18.5 to 24.9 is normal. ? A BMI of 25 to 29.9 is considered overweight. ? A BMI of 30 and above is considered obese. Watch levels of cholesterol and blood lipids  You should start having your blood tested for lipids and cholesterol at 37 years of age, then have this test every 5 years.  You may need to have your cholesterol levels checked more often if: ? Your lipid or cholesterol levels are high. ? You are older than 37 years of age. ? You are at high risk for heart disease. Cancer screening Lung Cancer  Lung cancer screening is recommended for adults 22-70 years old who are at high risk for lung cancer because of a history of smoking.  A yearly low-dose CT scan of the  lungs is recommended for people who: ? Currently smoke. ? Have quit within the past 15 years. ? Have at least a 30-pack-year history of smoking. A pack year is smoking an average of one pack of cigarettes a day for 1 year.  Yearly screening should continue until it has been 15 years since  you quit.  Yearly screening should stop if you develop a health problem that would prevent you from having lung cancer treatment. Breast Cancer  Practice breast self-awareness. This means understanding how your breasts normally appear and feel.  It also means doing regular breast self-exams. Let your health care provider know about any changes, no matter how small.  If you are in your 20s or 30s, you should have a clinical breast exam (CBE) by a health care provider every 1-3 years as part of a regular health exam.  If you are 95 or older, have a CBE every year. Also consider having a breast X-ray (mammogram) every year.  If you have a family history of breast cancer, talk to your health care provider about genetic screening.  If you are at high risk for breast cancer, talk to your health care provider about having an MRI and a mammogram every year.  Breast cancer gene (BRCA) assessment is recommended for women who have family members with BRCA-related cancers. BRCA-related cancers include: ? Breast. ? Ovarian. ? Tubal. ? Peritoneal cancers.  Results of the assessment will determine the need for genetic counseling and BRCA1 and BRCA2 testing. Cervical Cancer Your health care provider may recommend that you be screened regularly for cancer of the pelvic organs (ovaries, uterus, and vagina). This screening involves a pelvic examination, including checking for microscopic changes to the surface of your cervix (Pap test). You may be encouraged to have this screening done every 3 years, beginning at age 34.  For women ages 18-65, health care providers may recommend pelvic exams and Pap testing every 3 years, or they may recommend the Pap and pelvic exam, combined with testing for human papilloma virus (HPV), every 5 years. Some types of HPV increase your risk of cervical cancer. Testing for HPV may also be done on women of any age with unclear Pap test results.  Other health care providers  may not recommend any screening for nonpregnant women who are considered low risk for pelvic cancer and who do not have symptoms. Ask your health care provider if a screening pelvic exam is right for you.  If you have had past treatment for cervical cancer or a condition that could lead to cancer, you need Pap tests and screening for cancer for at least 20 years after your treatment. If Pap tests have been discontinued, your risk factors (such as having a new sexual partner) need to be reassessed to determine if screening should resume. Some women have medical problems that increase the chance of getting cervical cancer. In these cases, your health care provider may recommend more frequent screening and Pap tests. Colorectal Cancer  This type of cancer can be detected and often prevented.  Routine colorectal cancer screening usually begins at 37 years of age and continues through 37 years of age.  Your health care provider may recommend screening at an earlier age if you have risk factors for colon cancer.  Your health care provider may also recommend using home test kits to check for hidden blood in the stool.  A small camera at the end of a tube can be used to examine your colon  directly (sigmoidoscopy or colonoscopy). This is done to check for the earliest forms of colorectal cancer.  Routine screening usually begins at age 36.  Direct examination of the colon should be repeated every 5-10 years through 37 years of age. However, you may need to be screened more often if early forms of precancerous polyps or small growths are found. Skin Cancer  Check your skin from head to toe regularly.  Tell your health care provider about any new moles or changes in moles, especially if there is a change in a mole's shape or color.  Also tell your health care provider if you have a mole that is larger than the size of a pencil eraser.  Always use sunscreen. Apply sunscreen liberally and repeatedly  throughout the day.  Protect yourself by wearing long sleeves, pants, a wide-brimmed hat, and sunglasses whenever you are outside. Heart disease, diabetes, and high blood pressure  High blood pressure causes heart disease and increases the risk of stroke. High blood pressure is more likely to develop in: ? People who have blood pressure in the high end of the normal range (130-139/85-89 mm Hg). ? People who are overweight or obese. ? People who are African American.  If you are 32-55 years of age, have your blood pressure checked every 3-5 years. If you are 78 years of age or older, have your blood pressure checked every year. You should have your blood pressure measured twice-once when you are at a hospital or clinic, and once when you are not at a hospital or clinic. Record the average of the two measurements. To check your blood pressure when you are not at a hospital or clinic, you can use: ? An automated blood pressure machine at a pharmacy. ? A home blood pressure monitor.  If you are between 30 years and 7 years old, ask your health care provider if you should take aspirin to prevent strokes.  Have regular diabetes screenings. This involves taking a blood sample to check your fasting blood sugar level. ? If you are at a normal weight and have a low risk for diabetes, have this test once every three years after 37 years of age. ? If you are overweight and have a high risk for diabetes, consider being tested at a younger age or more often. Preventing infection Hepatitis B  If you have a higher risk for hepatitis B, you should be screened for this virus. You are considered at high risk for hepatitis B if: ? You were born in a country where hepatitis B is common. Ask your health care provider which countries are considered high risk. ? Your parents were born in a high-risk country, and you have not been immunized against hepatitis B (hepatitis B vaccine). ? You have HIV or AIDS. ? You  use needles to inject street drugs. ? You live with someone who has hepatitis B. ? You have had sex with someone who has hepatitis B. ? You get hemodialysis treatment. ? You take certain medicines for conditions, including cancer, organ transplantation, and autoimmune conditions. Hepatitis C  Blood testing is recommended for: ? Everyone born from [birth year] through 1964. ? Anyone with known risk factors for hepatitis C. Sexually transmitted infections (STIs)  You should be screened for sexually transmitted infections (STIs) including gonorrhea and chlamydia if: ? You are sexually active and are younger than 37 years of age. ? You are older than 37 years of age and your health care provider tells you that  you are at risk for this type of infection. ? Your sexual activity has changed since you were last screened and you are at an increased risk for chlamydia or gonorrhea. Ask your health care provider if you are at risk.  If you do not have HIV, but are at risk, it may be recommended that you take a prescription medicine daily to prevent HIV infection. This is called pre-exposure prophylaxis (PrEP). You are considered at risk if: ? You are sexually active and do not regularly use condoms or know the HIV status of your partner(s). ? You take drugs by injection. ? You are sexually active with a partner who has HIV. Talk with your health care provider about whether you are at high risk of being infected with HIV. If you choose to begin PrEP, you should first be tested for HIV. You should then be tested every 3 months for as long as you are taking PrEP. Pregnancy  If you are premenopausal and you may become pregnant, ask your health care provider about preconception counseling.  If you may become pregnant, take 400 to 800 micrograms (mcg) of folic acid every day.  If you want to prevent pregnancy, talk to your health care provider about birth control (contraception). Osteoporosis and  menopause  Osteoporosis is a disease in which the bones lose minerals and strength with aging. This can result in serious bone fractures. Your risk for osteoporosis can be identified using a bone density scan.  If you are 68 years of age or older, or if you are at risk for osteoporosis and fractures, ask your health care provider if you should be screened.  Ask your health care provider whether you should take a calcium or vitamin D supplement to lower your risk for osteoporosis.  Menopause may have certain physical symptoms and risks.  Hormone replacement therapy may reduce some of these symptoms and risks. Talk to your health care provider about whether hormone replacement therapy is right for you. Follow these instructions at home:  Schedule regular health, dental, and eye exams.  Stay current with your immunizations.  Do not use any tobacco products including cigarettes, chewing tobacco, or electronic cigarettes.  If you are pregnant, do not drink alcohol.  If you are breastfeeding, limit how much and how often you drink alcohol.  Limit alcohol intake to no more than 1 drink per day for nonpregnant women. One drink equals 12 ounces of beer, 5 ounces of wine, or 1 ounces of hard liquor.  Do not use street drugs.  Do not share needles.  Ask your health care provider for help if you need support or information about quitting drugs.  Tell your health care provider if you often feel depressed.  Tell your health care provider if you have ever been abused or do not feel safe at home. This information is not intended to replace advice given to you by your health care provider. Make sure you discuss any questions you have with your health care provider. Document Released: 03/07/2011 Document Revised: 01/28/2016 Document Reviewed: 05/26/2015 Elsevier Interactive Patient Education  2019 [employer] Inc.      Agustina Caroli, MD [facility]

## 2018-10-30 ENCOUNTER — Encounter: Payer: Self-pay | Admitting: Emergency Medicine

## 2018-10-30 LAB — CBC WITH DIFFERENTIAL/PLATELET
BASOS ABS: 0 10*3/uL (ref 0.0–0.2)
Basos: 0 %
EOS (ABSOLUTE): 0.1 10*3/uL (ref 0.0–0.4)
Eos: 1 %
HEMOGLOBIN: 12 g/dL (ref 11.1–15.9)
Hematocrit: 36 % (ref 34.0–46.6)
IMMATURE GRANULOCYTES: 0 %
Immature Grans (Abs): 0 10*3/uL (ref 0.0–0.1)
LYMPHS ABS: 1.9 10*3/uL (ref 0.7–3.1)
LYMPHS: 20 %
MCH: 28.5 pg (ref 26.6–33.0)
MCHC: 33.3 g/dL (ref 31.5–35.7)
MCV: 86 fL (ref 79–97)
MONOCYTES: 4 %
Monocytes Absolute: 0.4 10*3/uL (ref 0.1–0.9)
NEUTROS PCT: 75 %
Neutrophils Absolute: 7.2 10*3/uL — ABNORMAL HIGH (ref 1.4–7.0)
Platelets: 218 10*3/uL (ref 150–450)
RBC: 4.21 x10E6/uL (ref 3.77–5.28)
RDW: 12.7 % (ref 11.7–15.4)
WBC: 9.6 10*3/uL (ref 3.4–10.8)

## 2018-10-30 LAB — COMPREHENSIVE METABOLIC PANEL
ALBUMIN: 4.2 g/dL (ref 3.8–4.8)
ALT: 28 IU/L (ref 0–32)
AST: 24 IU/L (ref 0–40)
Albumin/Globulin Ratio: 1.4 (ref 1.2–2.2)
Alkaline Phosphatase: 86 IU/L (ref 39–117)
BUN / CREAT RATIO: 8 — AB (ref 9–23)
BUN: 5 mg/dL — AB (ref 6–20)
Bilirubin Total: 0.2 mg/dL (ref 0.0–1.2)
CO2: 22 mmol/L (ref 20–29)
CREATININE: 0.64 mg/dL (ref 0.57–1.00)
Calcium: 9.3 mg/dL (ref 8.7–10.2)
Chloride: 105 mmol/L (ref 96–106)
GFR calc Af Amer: 132 mL/min/{1.73_m2} (ref >59)
GFR calc non Af Amer: 114 mL/min/{1.73_m2} (ref >59)
GLUCOSE: 85 mg/dL (ref 65–99)
Globulin, Total: 3 g/dL (ref 1.5–4.5)
Potassium: 4.2 mmol/L (ref 3.5–5.2)
Sodium: 141 mmol/L (ref 134–144)
Total Protein: 7.2 g/dL (ref 6.0–8.5)

## 2018-10-30 LAB — LIPID PANEL
CHOLESTEROL TOTAL: 128 mg/dL (ref 100–199)
Chol/HDL Ratio: 2.8 ratio (ref 0.0–4.4)
HDL: 46 mg/dL (ref >39)
LDL Calculated: 61 mg/dL (ref 0–99)
TRIGLYCERIDES: 105 mg/dL (ref 0–149)
VLDL CHOLESTEROL CAL: 21 mg/dL (ref 5–40)

## 2018-10-30 LAB — TSH: TSH: 2.27 u[IU]/mL (ref 0.450–4.500)

## 2018-10-31 ENCOUNTER — Encounter: Payer: Self-pay | Admitting: Radiology

## 2018-10-31 ENCOUNTER — Encounter: Payer: Self-pay | Admitting: *Deleted

## 2019-02-13 ENCOUNTER — Other Ambulatory Visit: Payer: Self-pay

## 2019-02-13 ENCOUNTER — Emergency Department
Admission: EM | Admit: 2019-02-13 | Discharge: 2019-02-13 | Disposition: A | Discharge status: Home / Self Care | Payer: No Typology Code available for payment source | Attending: Emergency Medicine | Admitting: Emergency Medicine

## 2019-02-13 ENCOUNTER — Encounter: Payer: Self-pay | Admitting: Emergency Medicine

## 2019-02-13 DIAGNOSIS — Y990 Civilian activity done for income or pay: Secondary | ICD-10-CM | POA: Insufficient documentation

## 2019-02-13 DIAGNOSIS — Z7721 Contact with and (suspected) exposure to potentially hazardous body fluids: Secondary | ICD-10-CM | POA: Insufficient documentation

## 2019-02-13 NOTE — ED Triage Notes (Addendum)
Patient ambulatory to triage with steady gait, without difficulty or distress noted; pt reports while at work ([facility]) was cut to left thumb with glass while working with vaginal swabs; workers comp profile indicates UDS and breath analysis required. EDT Mayra to triage to complete workers comp profile requirements

## 2019-02-13 NOTE — ED Provider Notes (Signed)
[facility] Emergency Department Provider Note       Time seen: ----------------------------------------- 10:31 PM on 02/13/2019 -----------------------------------------   I have reviewed the triage vital signs and the nursing notes.  HISTORY   Chief Complaint Body Fluid Exposure   HPI Penny Bishop is a 38 y.o. female with a history of hepatitis B, pneumonia who presents to the ED for a body fluid exposure.  Patient states she was working with vaginal swabs and ended up cutting herself with a piece of glass into her left thumb.  She was able to pull the piece of glass out.  She has not had any further bleeding.  Past Medical History:  Diagnosis Date  . Hepatitis B ~ 2001  . Iron deficiency anemia   . Maternal fever during labor 03/20/2011  . Pneumonia 08/20/2014  . Pneumonia involving right lung 08/20/2014  . Status post repeat low transverse cesarean section 03/23/2011    Patient Active Problem List   Diagnosis Date Noted  . Hair loss 12/29/2014  . Anemia, iron deficiency 12/29/2014    Past Surgical History:  Procedure Laterality Date  . CESAREAN SECTION  04/05/2009;   . CESAREAN SECTION  03/20/2011   Procedure: CESAREAN SECTION;  Surgeon: Kathreen CosierBernard A Marshall, MD;  Location: [facility];  Service: Gynecology;  Laterality: N/A;  cord ph 7.18, fundal massage by [employer]  . COSMETIC SURGERY      Allergies Chlorhexidine gluconate  Social History Social History   Tobacco Use  . Smoking status: Never Smoker  . Smokeless tobacco: Never Used  Substance Use Topics  . Alcohol use: No  . Drug use: No   Review of Systems Constitutional: Negative for fever. Cardiovascular: Negative for chest pain. Respiratory: Negative for shortness of breath. Gastrointestinal: Negative for abdominal pain, vomiting and diarrhea. Musculoskeletal: Negative for back pain. Skin: Negative for rash. Neurological: Negative for headaches, focal weakness or  numbness.  All systems negative/normal/unremarkable except as stated in the HPI  ____________________________________________   PHYSICAL EXAM:  VITAL SIGNS: ED Triage Vitals [02/13/19 2154]  Enc Vitals Group     BP 122/80     Pulse Rate 85     Resp 18     Temp 98.3 F (36.8 C)     Temp Source Oral     SpO2 98 %     Weight 125 lb 8 oz (56.9 kg)     Height 5\' 1"  (1.549 m)     Head Circumference      Peak Flow      Pain Score 0     Pain Loc      Pain Edu?      Excl. in GC?    Constitutional: Alert and oriented. Well appearing and in no distress. Eyes: Conjunctivae are normal. Normal extraocular movements. Musculoskeletal: Nontender with normal range of motion in extremities. No lower extremity tenderness nor edema. Neurologic:  Normal speech and language. No gross focal neurologic deficits are appreciated.  Skin:  Skin is warm, dry and intact. No rash noted. Psychiatric: Mood and affect are normal. Speech and behavior are normal.  ____________________________________________  ED COURSE:  As part of my medical decision making, I reviewed the following data within the electronic MEDICAL RECORD NUMBER History obtained from family if available, nursing notes, old chart and ekg, as well as notes from prior ED visits. Patient presented for body fluid exposure, we will assess with labs as indicated   Procedures  Penny Bishop was evaluated in Emergency  Department on 02/13/2019 for the symptoms described in the history of present illness. She was evaluated in the context of the global COVID-19 pandemic, which necessitated consideration that the patient might be at risk for infection with the SARS-CoV-2 virus that causes COVID-19. Institutional protocols and algorithms that pertain to the evaluation of patients at risk for COVID-19 are in a state of rapid change based on information released by regulatory bodies including the CDC and federal and state organizations. These policies and  algorithms were followed during the patient's care in the ED.   ____________________________________________   DIFFERENTIAL DIAGNOSIS   Bodily fluid exposure  FINAL ASSESSMENT AND PLAN  Bodily fluid exposure   Plan: The patient had presented for a bodily fluid exposure.  I have discussed with infectious disease, she is felt to be too low risk for post exposure prophylaxis.  We have ordered the typical occupational health blood work.  She is stable for outpatient follow-up.   Laurence Aly, MD    Note: This note was generated in part or whole with voice recognition software. Voice recognition is usually quite accurate but there are transcription errors that can and very often do occur. I apologize for any typographical errors that were not detected and corrected.     Earleen Newport, MD 02/13/19 2246

## 2019-02-13 NOTE — ED Notes (Signed)
UDS and breath analysis complete per worker comp protocols. UDS specimen labeled/collected and walked to lab by this tech.

## 2019-05-09 ENCOUNTER — Other Ambulatory Visit: Payer: Self-pay

## 2019-05-09 ENCOUNTER — Encounter: Payer: Self-pay | Admitting: Family Medicine

## 2019-05-09 ENCOUNTER — Ambulatory Visit: Payer: Managed Care, Other (non HMO) | Admitting: Family Medicine

## 2019-05-09 VITALS — BP 108/66 | HR 79 | Temp 98.7°F | Resp 17 | Ht 61.0 in | Wt 125.0 lb

## 2019-05-09 DIAGNOSIS — G43009 Migraine without aura, not intractable, without status migrainosus: Secondary | ICD-10-CM

## 2019-05-09 DIAGNOSIS — Z23 Encounter for immunization: Secondary | ICD-10-CM | POA: Diagnosis not present

## 2019-05-09 MED ORDER — TRAMADOL HCL 50 MG PO TABS: 50.0000 mg | ORAL_TABLET | Freq: Three times a day (TID) | ORAL | 0 refills | Status: AC | PRN

## 2019-05-09 MED ORDER — RIZATRIPTAN BENZOATE 5 MG PO TABS: 5.0000 mg | ORAL_TABLET | ORAL | 0 refills | Status: DC | PRN

## 2019-05-09 NOTE — Patient Instructions (Addendum)
Try to pay attention to what food you have eaten prior to having a bad headache.  You might notice that there is some pattern with time of a given food that acts as a trigger of some of your headaches.  With migraine headaches it is wise to try to avoid all caffeine and see if they start doing better.  When you have a bad headache, it is best to try and stop the headache by taking something for it rather than waiting too long.  Over-the-counter medications for pain include: Acetaminophen (Tylenol, paracetamol in [place]) 500 mg 2 pills 3 times daily for a maximum of 3000 mg in 24 hours Ibuprofen 200 mg 2 to 4 pills 3 times daily as needed for headache.  Maximum dose of 2400 mg in 24 hours.  Best taken with food. You can actually overlap these 2 and take both of them simultaneously if needed.  In different fashions and are not related chemically.  A few prescription pain pills called tramadol 50 mg which you can take 1 every 8 hours as needed for pain.  If a severe headache continues to persist, go ahead and take the Maxalt as ordered.  Take 1 pill, can repeat in 2 hours if needed.  Sometimes can cause a little nausea so it is good to take it with a little food.  Do not exceed the 2 doses in 24 hours.  If despite these measures you continue having a lot of bad headaches, referral will need to be made to a neurologist for further evaluation.  [facility] and [facility] are to place in [place] for that do a lot of migraine care.  In the event of a sudden severe unresponsive headache or a headache associated with other neurologic concerns go to an emergency room.   Migraine Headache A migraine headache is an intense, throbbing pain on one side or both sides of the head. Migraine headaches may also cause other symptoms, such as nausea, vomiting, and sensitivity to light and noise. A migraine headache can last from 4 hours to 3 days. Talk with your doctor about what things may  bring on (trigger) your migraine headaches. What are the causes? The exact cause of this condition is not known. However, a migraine may be caused when nerves in the brain become irritated and release chemicals that cause inflammation of blood vessels. This inflammation causes pain. This condition may be triggered or caused by:  Drinking alcohol.  Smoking.  Taking medicines, such as: ? Medicine used to treat chest pain (nitroglycerin). ? Birth control pills. ? Estrogen. ? Certain blood pressure medicines.  Eating or drinking products that contain nitrates, glutamate, aspartame, or tyramine. Aged cheeses, chocolate, or caffeine may also be triggers.  Doing physical activity. Other things that may trigger a migraine headache include:  Menstruation.  Pregnancy.  Hunger.  Stress.  Lack of sleep or too much sleep.  Weather changes.  Fatigue. What increases the risk? The following factors may make you more likely to experience migraine headaches:  Being a certain age. This condition is more common in people who are 7725-37 years old.  Being female.  Having a family history of migraine headaches.  Being Caucasian.  Having a mental health condition, such as depression or anxiety.  Being obese. What are the signs or symptoms? The main symptom of this condition is pulsating or throbbing pain. This pain may:  Happen in any area of the head, such as on one side  or both sides.  Interfere with daily activities.  Get worse with physical activity.  Get worse with exposure to bright lights or loud noises. Other symptoms may include:  Nausea.  Vomiting.  Dizziness.  General sensitivity to bright lights, loud noises, or smells. Before you get a migraine headache, you may get warning signs (an aura). An aura may include:  Seeing flashing lights or having blind spots.  Seeing bright spots, halos, or zigzag lines.  Having tunnel vision or blurred vision.  Having  numbness or a tingling feeling.  Having trouble talking.  Having muscle weakness. Some people have symptoms after a migraine headache (postdromal phase), such as:  Feeling tired.  Difficulty concentrating. How is this diagnosed? A migraine headache can be diagnosed based on:  Your symptoms.  A physical exam.  Tests, such as: ? CT scan or an MRI of the head. These imaging tests can help rule out other causes of headaches. ? Taking fluid from the spine (lumbar puncture) and analyzing it (cerebrospinal fluid analysis, or CSF analysis). How is this treated? This condition may be treated with medicines that:  Relieve pain.  Relieve nausea.  Prevent migraine headaches. Treatment for this condition may also include:  Acupuncture.  Lifestyle changes like avoiding foods that trigger migraine headaches.  Biofeedback.  Cognitive behavioral therapy. Follow these instructions at home: Medicines  Take over-the-counter and prescription medicines only as told by your health care provider.  Ask your health care provider if the medicine prescribed to you: ? Requires you to avoid driving or using heavy machinery. ? Can cause constipation. You may need to take these actions to prevent or treat constipation:  Drink enough fluid to keep your urine pale yellow.  Take over-the-counter or prescription medicines.  Eat foods that are high in fiber, such as beans, whole grains, and fresh fruits and vegetables.  Limit foods that are high in fat and processed sugars, such as fried or sweet foods. Lifestyle  Do not drink alcohol.  Do not use any products that contain nicotine or tobacco, such as cigarettes, e-cigarettes, and chewing tobacco. If you need help quitting, ask your health care provider.  Get at least 8 hours of sleep every night.  Find ways to manage stress, such as meditation, deep breathing, or yoga. General instructions      Keep a journal to find out what may  trigger your migraine headaches. For example, write down: ? What you eat and drink. ? How much sleep you get. ? Any change to your diet or medicines.  If you have a migraine headache: ? Avoid things that make your symptoms worse, such as bright lights. ? It may help to lie down in a dark, quiet room. ? Do not drive or use heavy machinery. ? Ask your health care provider what activities are safe for you while you are experiencing symptoms.  Keep all follow-up visits as told by your health care provider. This is important. Contact a health care provider if:  You develop symptoms that are different or more severe than your usual migraine headache symptoms.  You have more than 15 headache days in one month. Get help right away if:  Your migraine headache becomes severe.  Your migraine headache lasts longer than 72 hours.  You have a fever.  You have a stiff neck.  You have vision loss.  Your muscles feel weak or like you cannot control them.  You start to lose your balance often.  You have trouble walking.  You faint.  You have a seizure. Summary  A migraine headache is an intense, throbbing pain on one side or both sides of the head. Migraines may also cause other symptoms, such as nausea, vomiting, and sensitivity to light and noise.  This condition may be treated with medicines and lifestyle changes. You may also need to avoid certain things that trigger a migraine headache.  Keep a journal to find out what may trigger your migraine headaches.  Contact your health care provider if you have more than 15 headache days in a month or you develop symptoms that are different or more severe than your usual migraine headache symptoms. This information is not intended to replace advice given to you by your health care provider. Make sure you discuss any questions you have with your health care provider. Document Released: 08/22/2005 Document Revised: 12/14/2018 Document  Reviewed: 10/04/2018 Elsevier Patient Education  [employer].    If you have lab work done today you will be contacted with your lab results within the next 2 weeks.  If you have not heard from [place] then please contact us. The fastest way to get your results is to register for My Chart.   IF you received an x-ray today, you will receive an invoice from [facility]. Please contact [facility] at [phone] with questions or concerns regarding your invoice.   IF you received labwork today, you will receive an invoice from [facility]. Please contact [facility] at [phone] with questions or concerns regarding your invoice.   Our billing staff will not be able to assist you with questions regarding bills from these companies.  You will be contacted with the lab results as soon as they are available. The fastest way to get your results is to activate your My Chart account. Instructions are located on the last page of this paperwork. If you have not heard from [place] regarding the results in 2 weeks, please contact this office.

## 2019-05-09 NOTE — Progress Notes (Signed)
Patient ID: Penny Bishop, female    DOB: [birth year]  Age: 38 y.o. MRN: [id number]  Chief Complaint  Patient presents with  . migraines x 1 month intermittent    migraine started yesterday at 2 pm and lasted until 11:45 pm after 2 advil, migraines are random and no triggers that pt knows of.  Sometimes she'll have one after breakfast or between 11-11:30 pm.  No nausea or emesis, no sensitivity to light or sound but per pt she does where her sun glasses if she goes out into the sun.  Pain level 8/10, yesterday's migraine 10/10 and felt like someone was beating her in the head with a hammer.  Per pt 2-3 migraines per week.  No headache today.    Subjective:   38 year old lady with a history of 1 month of having increasing headaches.  They are generalized or frontal or occipital.  No known trigger.  Sometimes gets him as often as several times a day.  There have been no major changes in her life.  No major changes of foods or medicines or environment.  She works at a lab as a Pensions consultanttechnician.  She does not have a history of major migraines in the past, though 5 years ago when she was hospitalized with a pneumonia she had bad headache and they did do a CT scan of her head then which was normal.  She has 2 children.  She had her last menstrual.  A week ago and is not pregnant and is not going to be.  She is on some supplements and also on some levothyroxine and NP thyroid prescribed for her hair.  She did have a normal TSH back early this year.  She has been on that medication for a month or so.  Current allergies, medications, problem list, past/family and social histories reviewed.  Objective:  BP 108/66 (BP Location: Right Arm, Patient Position: Sitting, Cuff Size: Normal) Comment: manual  Pulse 79   Temp 98.7 F (37.1 C) (Oral)   Resp 17   Ht 5\' 1"  (1.549 m)   Wt 125 lb (56.7 kg)   LMP 05/02/2019   SpO2 99%   BMI 23.62 kg/m   Young lady in no major distress.  TMs normal.  Eyes PRL.  EOMs intact.   Throat clear.  Neck supple without nodes thyromegaly.  No carotid bruits.  Chest clear.  Heart rate without murmur.  Deep tendon reflex symmetrical.  Romberg negative.  Tandem walk normal.  Motor strength good.  Sensory grossly intact.  Fully alert and oriented.  Assessment & Plan:   Assessment: 1. Migraine without aura and without status migrainosus, not intractable   2. Need for prophylactic vaccination and inoculation against influenza       Plan: See instructions.  No orders of the defined types were placed in this encounter.   Meds ordered this encounter  Medications  . traMADol (ULTRAM) 50 MG tablet    Sig: Take 1 tablet (50 mg total) by mouth every 8 (eight) hours as needed for up to 5 days.    Dispense:  15 tablet    Refill:  0  . rizatriptan (MAXALT) 5 MG tablet    Sig: Take 1 tablet (5 mg total) by mouth as needed for migraine. May repeat in 2 hours if needed    Dispense:  10 tablet    Refill:  0         Patient Instructions   Try to pay attention to what food  you have eaten prior to having a bad headache.  You might notice that there is some pattern with time of a given food that acts as a trigger of some of your headaches.  With migraine headaches it is wise to try to avoid all caffeine and see if they start doing better.  When you have a bad headache, it is best to try and stop the headache by taking something for it rather than waiting too long.  Over-the-counter medications for pain include: Acetaminophen (Tylenol, paracetamol in [place]) 500 mg 2 pills 3 times daily for a maximum of 3000 mg in 24 hours Ibuprofen 200 mg 2 to 4 pills 3 times daily as needed for headache.  Maximum dose of 2400 mg in 24 hours.  Best taken with food. You can actually overlap these 2 and take both of them simultaneously if needed.  In different fashions and are not related chemically.  A few prescription pain pills called tramadol 50 mg which you can take 1 every 8 hours as  needed for pain.  If a severe headache continues to persist, go ahead and take the Maxalt as ordered.  Take 1 pill, can repeat in 2 hours if needed.  Sometimes can cause a little nausea so it is good to take it with a little food.  Do not exceed the 2 doses in 24 hours.  If despite these measures you continue having a lot of bad headaches, referral will need to be made to a neurologist for further evaluation.  [facility] are to place in [place] for that do a lot of migraine care.  In the event of a sudden severe unresponsive headache or a headache associated with other neurologic concerns go to an emergency room.   Migraine Headache A migraine headache is an intense, throbbing pain on one side or both sides of the head. Migraine headaches may also cause other symptoms, such as nausea, vomiting, and sensitivity to light and noise. A migraine headache can last from 4 hours to 3 days. Talk with your doctor about what things may bring on (trigger) your migraine headaches. What are the causes? The exact cause of this condition is not known. However, a migraine may be caused when nerves in the brain become irritated and release chemicals that cause inflammation of blood vessels. This inflammation causes pain. This condition may be triggered or caused by:  Drinking alcohol.  Smoking.  Taking medicines, such as: ? Medicine used to treat chest pain (nitroglycerin). ? Birth control pills. ? Estrogen. ? Certain blood pressure medicines.  Eating or drinking products that contain nitrates, glutamate, aspartame, or tyramine. Aged cheeses, chocolate, or caffeine may also be triggers.  Doing physical activity. Other things that may trigger a migraine headache include:  Menstruation.  Pregnancy.  Hunger.  Stress.  Lack of sleep or too much sleep.  Weather changes.  Fatigue. What increases the risk? The following factors may make you more likely to  experience migraine headaches:  Being a certain age. This condition is more common in people who are 5525-37 years old.  Being female.  Having a family history of migraine headaches.  Being Caucasian.  Having a mental health condition, such as depression or anxiety.  Being obese. What are the signs or symptoms? The main symptom of this condition is pulsating or throbbing pain. This pain may:  Happen in any area of the head, such as on one side or both sides.  Interfere with  daily activities.  Get worse with physical activity.  Get worse with exposure to bright lights or loud noises. Other symptoms may include:  Nausea.  Vomiting.  Dizziness.  General sensitivity to bright lights, loud noises, or smells. Before you get a migraine headache, you may get warning signs (an aura). An aura may include:  Seeing flashing lights or having blind spots.  Seeing bright spots, halos, or zigzag lines.  Having tunnel vision or blurred vision.  Having numbness or a tingling feeling.  Having trouble talking.  Having muscle weakness. Some people have symptoms after a migraine headache (postdromal phase), such as:  Feeling tired.  Difficulty concentrating. How is this diagnosed? A migraine headache can be diagnosed based on:  Your symptoms.  A physical exam.  Tests, such as: ? CT scan or an MRI of the head. These imaging tests can help rule out other causes of headaches. ? Taking fluid from the spine (lumbar puncture) and analyzing it (cerebrospinal fluid analysis, or CSF analysis). How is this treated? This condition may be treated with medicines that:  Relieve pain.  Relieve nausea.  Prevent migraine headaches. Treatment for this condition may also include:  Acupuncture.  Lifestyle changes like avoiding foods that trigger migraine headaches.  Biofeedback.  Cognitive behavioral therapy. Follow these instructions at home: Medicines  Take over-the-counter and  prescription medicines only as told by your health care provider.  Ask your health care provider if the medicine prescribed to you: ? Requires you to avoid driving or using heavy machinery. ? Can cause constipation. You may need to take these actions to prevent or treat constipation:  Drink enough fluid to keep your urine pale yellow.  Take over-the-counter or prescription medicines.  Eat foods that are high in fiber, such as beans, whole grains, and fresh fruits and vegetables.  Limit foods that are high in fat and processed sugars, such as fried or sweet foods. Lifestyle  Do not drink alcohol.  Do not use any products that contain nicotine or tobacco, such as cigarettes, e-cigarettes, and chewing tobacco. If you need help quitting, ask your health care provider.  Get at least 8 hours of sleep every night.  Find ways to manage stress, such as meditation, deep breathing, or yoga. General instructions      Keep a journal to find out what may trigger your migraine headaches. For example, write down: ? What you eat and drink. ? How much sleep you get. ? Any change to your diet or medicines.  If you have a migraine headache: ? Avoid things that make your symptoms worse, such as bright lights. ? It may help to lie down in a dark, quiet room. ? Do not drive or use heavy machinery. ? Ask your health care provider what activities are safe for you while you are experiencing symptoms.  Keep all follow-up visits as told by your health care provider. This is important. Contact a health care provider if:  You develop symptoms that are different or more severe than your usual migraine headache symptoms.  You have more than 15 headache days in one month. Get help right away if:  Your migraine headache becomes severe.  Your migraine headache lasts longer than 72 hours.  You have a fever.  You have a stiff neck.  You have vision loss.  Your muscles feel weak or like you cannot  control them.  You start to lose your balance often.  You have trouble walking.  You faint.  You have  a seizure. Summary  A migraine headache is an intense, throbbing pain on one side or both sides of the head. Migraines may also cause other symptoms, such as nausea, vomiting, and sensitivity to light and noise.  This condition may be treated with medicines and lifestyle changes. You may also need to avoid certain things that trigger a migraine headache.  Keep a journal to find out what may trigger your migraine headaches.  Contact your health care provider if you have more than 15 headache days in a month or you develop symptoms that are different or more severe than your usual migraine headache symptoms. This information is not intended to replace advice given to you by your health care provider. Make sure you discuss any questions you have with your health care provider. Document Released: 08/22/2005 Document Revised: 12/14/2018 Document Reviewed: 10/04/2018 Elsevier Patient Education  [employer].    If you have lab work done today you will be contacted with your lab results within the next 2 weeks.  If you have not heard from [place] then please contact us. The fastest way to get your results is to register for My Chart.   IF you received an x-ray today, you will receive an invoice from [facility]. Please contact [facility] at [phone] with questions or concerns regarding your invoice.   IF you received labwork today, you will receive an invoice from [facility]. Please contact [facility] at [phone] with questions or concerns regarding your invoice.   Our billing staff will not be able to assist you with questions regarding bills from these companies.  You will be contacted with the lab results as soon as they are available. The fastest way to get your results is to activate your My Chart account. Instructions are located on the last page of this  paperwork. If you have not heard from [place] regarding the results in 2 weeks, please contact this office.        Return if symptoms worsen or fail to improve.   Ruben Reason, MD 05/09/2019

## 2019-08-20 ENCOUNTER — Telehealth (INDEPENDENT_AMBULATORY_CARE_PROVIDER_SITE_OTHER): Payer: Managed Care, Other (non HMO) | Admitting: Emergency Medicine

## 2019-08-20 ENCOUNTER — Encounter: Payer: Self-pay | Admitting: Emergency Medicine

## 2019-08-20 ENCOUNTER — Other Ambulatory Visit: Payer: Self-pay

## 2019-08-20 VITALS — Ht 61.0 in | Wt 126.0 lb

## 2019-08-20 DIAGNOSIS — R05 Cough: Secondary | ICD-10-CM

## 2019-08-20 DIAGNOSIS — R059 Cough, unspecified: Secondary | ICD-10-CM

## 2019-08-20 DIAGNOSIS — J22 Unspecified acute lower respiratory infection: Secondary | ICD-10-CM | POA: Diagnosis not present

## 2019-08-20 DIAGNOSIS — Z20828 Contact with and (suspected) exposure to other viral communicable diseases: Secondary | ICD-10-CM | POA: Diagnosis not present

## 2019-08-20 DIAGNOSIS — Z20822 Contact with and (suspected) exposure to covid-19: Secondary | ICD-10-CM

## 2019-08-20 MED ORDER — AZITHROMYCIN 250 MG PO TABS: ORAL_TABLET | ORAL | 0 refills | Status: DC

## 2019-08-20 MED ORDER — PREDNISONE 20 MG PO TABS: 40.0000 mg | ORAL_TABLET | Freq: Every day | ORAL | 0 refills | Status: AC

## 2019-08-20 MED ORDER — BENZONATATE 200 MG PO CAPS: 200.0000 mg | ORAL_CAPSULE | Freq: Two times a day (BID) | ORAL | 0 refills | Status: DC | PRN

## 2019-08-20 NOTE — Progress Notes (Signed)
Telemedicine Encounter- SOAP NOTE Established Patient  This telephone encounter was conducted with the patient's (or proxy's) verbal consent via audio telecommunications: yes/no: Yes Patient was instructed to have this encounter in a suitably private space; and to only have persons present to whom they give permission to participate. In addition, patient identity was confirmed by use of name plus two identifiers (DOB and address).  I discussed the limitations, risks, security and privacy concerns of performing an evaluation and management service by telephone and the availability of in person appointments. I also discussed with the patient that there may be a patient responsible charge related to this service. The patient expressed understanding and agreed to proceed.  I spent a total of TIME; 0 MIN TO 60 MIN: 15 minutes talking with the patient or their proxy.  Chief Complaint  Patient presents with  . Cough    symptoms started last Thursday with yellowish mucus,chest pain and lightheadness  . Shortness of Breath    per patient tested negative for COVID on 12/102020    Subjective   Penny Bishop is a 38 y.o. established female patient. Telephone visit today complaining of 8-day history of flulike symptoms with productive cough and some heaviness in the chest with intermittent difficulty breathing and lightheadedness.  States that she tested negative for Covid on 08/15/2019.  Family members at home also sick with similar symptoms but they are doing much better now.  She remains symptomatic.  No other significant symptoms.  HPI   Patient Active Problem List   Diagnosis Date Noted  . Hair loss 12/29/2014  . Anemia, iron deficiency 12/29/2014    Past Medical History:  Diagnosis Date  . Hepatitis B ~ 2001  . Iron deficiency anemia   . Maternal fever during labor 03/20/2011  . Pneumonia 08/20/2014  . Pneumonia involving right lung 08/20/2014  . Status post repeat low transverse  cesarean section 03/23/2011    Current Outpatient Medications  Medication Sig Dispense Refill  . B Complex Vitamins (VITAMIN B COMPLEX) TABS Take by mouth daily.    . Biotin 5000 MCG CAPS Take by mouth.    . iron polysaccharides (NIFEREX) 150 MG capsule Take 150 mg by mouth daily.    Marland Kitchen liothyronine (CYTOMEL) 5 MCG tablet Take 5 mcg by mouth daily.    . Multiple Vitamin (MULTIVITAMIN) tablet Take 1 tablet by mouth daily.    . rizatriptan (MAXALT) 5 MG tablet Take 1 tablet (5 mg total) by mouth as needed for migraine. May repeat in 2 hours if needed 10 tablet 0  . thyroid (NP THYROID) 15 MG tablet Take 15 mg by mouth daily.    Marland Kitchen VITAMIN D-VITAMIN K PO Take by mouth daily.     No current facility-administered medications for this visit.    Allergies  Allergen Reactions  . Chlorhexidine Gluconate Rash    Social History   Socioeconomic History  . Marital status: Married    Spouse name: Not on file  . Number of children: Not on file  . Years of education: Not on file  . Highest education level: Not on file  Occupational History  . Occupation: Estate manager/land agent  Tobacco Use  . Smoking status: Never Smoker  . Smokeless tobacco: Never Used  Substance and Sexual Activity  . Alcohol use: No  . Drug use: No  . Sexual activity: Yes  Other Topics Concern  . Not on file  Social History Narrative  . Not on file   Social Determinants  of Health   Financial Resource Strain:   . Difficulty of Paying Living Expenses: Not on file  Food Insecurity:   . Worried About Programme researcher, broadcasting/film/video in the Last Year: Not on file  . Ran Out of Food in the Last Year: Not on file  Transportation Needs:   . Lack of Transportation (Medical): Not on file  . Lack of Transportation (Non-Medical): Not on file  Physical Activity:   . Days of Exercise per Week: Not on file  . Minutes of Exercise per Session: Not on file  Stress:   . Feeling of Stress : Not on file  Social Connections:   . Frequency of  Communication with Friends and Family: Not on file  . Frequency of Social Gatherings with Friends and Family: Not on file  . Attends Religious Services: Not on file  . Active Member of Clubs or Organizations: Not on file  . Attends Banker Meetings: Not on file  . Marital Status: Not on file  Intimate Partner Violence:   . Fear of Current or Ex-Partner: Not on file  . Emotionally Abused: Not on file  . Physically Abused: Not on file  . Sexually Abused: Not on file    Review of Systems  Constitutional: Negative.  Negative for chills and fever.  HENT: Positive for congestion. Negative for sore throat.   Respiratory: Positive for cough, sputum production and shortness of breath.   Cardiovascular: Negative for chest pain and palpitations.  Gastrointestinal: Negative.  Negative for abdominal pain, blood in stool, diarrhea, nausea and vomiting.  Genitourinary: Negative.  Negative for dysuria.  Musculoskeletal: Negative for back pain, myalgias and neck pain.  Skin: Negative.  Negative for rash.  Neurological: Negative.  Negative for dizziness and headaches.  All other systems reviewed and are negative.   Objective  Alert and oriented x3 in no apparent respiratory distress. Vitals as reported by the patient: Today's Vitals   08/20/19 1030  Weight: 126 lb (57.2 kg)  Height: 5\' 1"  (1.549 m)    There are no diagnoses linked to this encounter. Miyo was seen today for cough and shortness of breath.  Diagnoses and all orders for this visit:  Cough -     benzonatate (TESSALON) 200 MG capsule; Take 1 capsule (200 mg total) by mouth 2 (two) times daily as needed for cough. -     predniSONE (DELTASONE) 20 MG tablet; Take 2 tablets (40 mg total) by mouth daily with breakfast for 5 days.  Lower respiratory infection -     azithromycin (ZITHROMAX) 250 MG tablet; Sig as indicated -     predniSONE (DELTASONE) 20 MG tablet; Take 2 tablets (40 mg total) by mouth daily with  breakfast for 5 days.  Suspected COVID-19 virus infection    Clinically stable.  Covid precautions and ED precautions given. I discussed the assessment and treatment plan with the patient. The patient was provided an opportunity to ask questions and all were answered. The patient agreed with the plan and demonstrated an understanding of the instructions.   The patient was advised to call back or seek an in-person evaluation if the symptoms worsen or if the condition fails to improve as anticipated.  I provided 15 minutes of non-face-to-face time during this encounter.  Jannet Askew, MD  Primary Care at [facility]

## 2019-08-23 ENCOUNTER — Ambulatory Visit
Admission: RE | Admit: 2019-08-23 | Discharge: 2019-08-23 | Disposition: A | Discharge status: Home / Self Care | Payer: Managed Care, Other (non HMO) | Source: Ambulatory Visit | Attending: Cardiovascular Disease | Admitting: Cardiovascular Disease

## 2019-08-23 ENCOUNTER — Other Ambulatory Visit: Payer: Self-pay | Admitting: Cardiovascular Disease

## 2019-08-23 DIAGNOSIS — R059 Cough, unspecified: Secondary | ICD-10-CM

## 2019-08-23 DIAGNOSIS — R05 Cough: Secondary | ICD-10-CM

## 2019-08-23 DIAGNOSIS — R0602 Shortness of breath: Secondary | ICD-10-CM

## 2019-08-26 ENCOUNTER — Inpatient Hospital Stay (HOSPITAL_COMMUNITY)
Admission: AD | Admit: 2019-08-26 | Discharge: 2019-08-30 | DRG: 153 | Disposition: A | Discharge status: Home / Self Care | Payer: Managed Care, Other (non HMO) | Source: Ambulatory Visit | Attending: Cardiovascular Disease | Admitting: Cardiovascular Disease

## 2019-08-26 ENCOUNTER — Other Ambulatory Visit: Payer: Self-pay

## 2019-08-26 DIAGNOSIS — J0140 Acute pansinusitis, unspecified: Secondary | ICD-10-CM | POA: Diagnosis present

## 2019-08-26 DIAGNOSIS — E039 Hypothyroidism, unspecified: Secondary | ICD-10-CM | POA: Diagnosis present

## 2019-08-26 DIAGNOSIS — Z833 Family history of diabetes mellitus: Secondary | ICD-10-CM

## 2019-08-26 DIAGNOSIS — J0100 Acute maxillary sinusitis, unspecified: Principal | ICD-10-CM | POA: Diagnosis present

## 2019-08-26 DIAGNOSIS — Z8619 Personal history of other infectious and parasitic diseases: Secondary | ICD-10-CM

## 2019-08-26 DIAGNOSIS — I249 Acute ischemic heart disease, unspecified: Secondary | ICD-10-CM | POA: Diagnosis present

## 2019-08-26 DIAGNOSIS — Z825 Family history of asthma and other chronic lower respiratory diseases: Secondary | ICD-10-CM

## 2019-08-26 DIAGNOSIS — Z888 Allergy status to other drugs, medicaments and biological substances status: Secondary | ICD-10-CM

## 2019-08-26 DIAGNOSIS — R0789 Other chest pain: Secondary | ICD-10-CM | POA: Diagnosis present

## 2019-08-26 DIAGNOSIS — Z79899 Other long term (current) drug therapy: Secondary | ICD-10-CM

## 2019-08-26 DIAGNOSIS — Z79890 Hormone replacement therapy (postmenopausal): Secondary | ICD-10-CM

## 2019-08-26 DIAGNOSIS — Z98891 History of uterine scar from previous surgery: Secondary | ICD-10-CM

## 2019-08-26 DIAGNOSIS — Z8249 Family history of ischemic heart disease and other diseases of the circulatory system: Secondary | ICD-10-CM

## 2019-08-26 DIAGNOSIS — E781 Pure hyperglyceridemia: Secondary | ICD-10-CM | POA: Diagnosis present

## 2019-08-26 DIAGNOSIS — Z20828 Contact with and (suspected) exposure to other viral communicable diseases: Secondary | ICD-10-CM | POA: Diagnosis present

## 2019-08-26 DIAGNOSIS — E860 Dehydration: Secondary | ICD-10-CM | POA: Diagnosis present

## 2019-08-26 LAB — COMPREHENSIVE METABOLIC PANEL
ALT: 33 U/L (ref 0–44)
AST: 23 U/L (ref 15–41)
Albumin: 3.9 g/dL (ref 3.5–5.0)
Alkaline Phosphatase: 65 U/L (ref 38–126)
Anion gap: 9 (ref 5–15)
BUN: 7 mg/dL (ref 6–20)
CO2: 27 mmol/L (ref 22–32)
Calcium: 9 mg/dL (ref 8.9–10.3)
Chloride: 104 mmol/L (ref 98–111)
Creatinine, Ser: 0.72 mg/dL (ref 0.44–1.00)
GFR calc Af Amer: >60 mL/min (ref >60)
GFR calc non Af Amer: >60 mL/min (ref >60)
Glucose, Bld: 86 mg/dL (ref 70–99)
Potassium: 3.6 mmol/L (ref 3.5–5.1)
Sodium: 140 mmol/L (ref 135–145)
Total Bilirubin: 0.6 mg/dL (ref 0.3–1.2)
Total Protein: 7.4 g/dL (ref 6.5–8.1)

## 2019-08-26 LAB — CBC WITH DIFFERENTIAL/PLATELET
Abs Immature Granulocytes: 0.05 10*3/uL (ref 0.00–0.07)
Basophils Absolute: 0 10*3/uL (ref 0.0–0.1)
Basophils Relative: 0 %
Eosinophils Absolute: 0.1 10*3/uL (ref 0.0–0.5)
Eosinophils Relative: 0 %
HCT: 39.1 % (ref 36.0–46.0)
Hemoglobin: 12.9 g/dL (ref 12.0–15.0)
Immature Granulocytes: 0 %
Lymphocytes Relative: 32 %
Lymphs Abs: 3.7 10*3/uL (ref 0.7–4.0)
MCH: 28.3 pg (ref 26.0–34.0)
MCHC: 33 g/dL (ref 30.0–36.0)
MCV: 85.7 fL (ref 80.0–100.0)
Monocytes Absolute: 0.6 10*3/uL (ref 0.1–1.0)
Monocytes Relative: 5 %
Neutro Abs: 7.1 10*3/uL (ref 1.7–7.7)
Neutrophils Relative %: 63 %
Platelets: 227 10*3/uL (ref 150–400)
RBC: 4.56 MIL/uL (ref 3.87–5.11)
RDW: 12.7 % (ref 11.5–15.5)
WBC: 11.5 10*3/uL — ABNORMAL HIGH (ref 4.0–10.5)
nRBC: 0 % (ref 0.0–0.2)

## 2019-08-26 LAB — TROPONIN I (HIGH SENSITIVITY)
Troponin I (High Sensitivity): <2 ng/L (ref <18)
Troponin I (High Sensitivity): 3 ng/L (ref <18)

## 2019-08-26 LAB — HIV ANTIBODY (ROUTINE TESTING W REFLEX): HIV Screen 4th Generation wRfx: NONREACTIVE

## 2019-08-26 LAB — SARS CORONAVIRUS 2 (TAT 6-24 HRS): SARS Coronavirus 2: NEGATIVE

## 2019-08-26 MED ORDER — LEVOTHYROXINE SODIUM 25 MCG PO TABS
25.0000 ug | ORAL_TABLET | Freq: Every day | ORAL | Status: DC
  Administered 2019-08-27 – 2019-08-30 (×4): 25 ug via ORAL
  Filled 2019-08-26 (×4): qty 1

## 2019-08-26 MED ORDER — ACETAMINOPHEN 325 MG PO TABS
650.0000 mg | ORAL_TABLET | ORAL | Status: DC | PRN
  Administered 2019-08-27 – 2019-08-28 (×2): 650 mg via ORAL
  Filled 2019-08-26 (×2): qty 2

## 2019-08-26 MED ORDER — ADULT MULTIVITAMIN W/MINERALS CH
1.0000 | ORAL_TABLET | Freq: Every day | ORAL | Status: DC
  Administered 2019-08-27 – 2019-08-30 (×4): 1 via ORAL
  Filled 2019-08-26 (×4): qty 1

## 2019-08-26 MED ORDER — NITROGLYCERIN 0.4 MG SL SUBL: 0.4000 mg | SUBLINGUAL_TABLET | SUBLINGUAL | Status: DC | PRN

## 2019-08-26 MED ORDER — ASPIRIN 81 MG PO CHEW
324.0000 mg | CHEWABLE_TABLET | ORAL | Status: AC
  Administered 2019-08-26: 19:00:00 324 mg via ORAL
  Filled 2019-08-26: qty 4

## 2019-08-26 MED ORDER — SODIUM CHLORIDE 0.9 % IV SOLN: INTRAVENOUS | Status: AC

## 2019-08-26 MED ORDER — ASPIRIN 300 MG RE SUPP
300.0000 mg | RECTAL | Status: AC
  Filled 2019-08-26: qty 1

## 2019-08-26 MED ORDER — HEPARIN BOLUS VIA INFUSION
3500.0000 [IU] | Freq: Once | INTRAVENOUS | Status: AC
  Administered 2019-08-26: 19:00:00 3500 [IU] via INTRAVENOUS
  Filled 2019-08-26: qty 3500

## 2019-08-26 MED ORDER — ATORVASTATIN CALCIUM 40 MG PO TABS
40.0000 mg | ORAL_TABLET | Freq: Every day | ORAL | Status: DC
  Administered 2019-08-26 – 2019-08-29 (×4): 40 mg via ORAL
  Filled 2019-08-26 (×4): qty 1

## 2019-08-26 MED ORDER — BENZONATATE 100 MG PO CAPS: 200.0000 mg | ORAL_CAPSULE | Freq: Two times a day (BID) | ORAL | Status: DC | PRN

## 2019-08-26 MED ORDER — ASPIRIN EC 81 MG PO TBEC
81.0000 mg | DELAYED_RELEASE_TABLET | Freq: Every day | ORAL | Status: DC
  Administered 2019-08-27 – 2019-08-30 (×4): 81 mg via ORAL
  Filled 2019-08-26 (×4): qty 1

## 2019-08-26 MED ORDER — POLYSACCHARIDE IRON COMPLEX 150 MG PO CAPS
150.0000 mg | ORAL_CAPSULE | Freq: Every day | ORAL | Status: DC
  Administered 2019-08-27 – 2019-08-30 (×4): 150 mg via ORAL
  Filled 2019-08-26 (×4): qty 1

## 2019-08-26 MED ORDER — ALBUTEROL SULFATE (2.5 MG/3ML) 0.083% IN NEBU
2.5000 mg | INHALATION_SOLUTION | Freq: Four times a day (QID) | RESPIRATORY_TRACT | Status: DC
  Filled 2019-08-26: qty 3

## 2019-08-26 MED ORDER — HEPARIN (PORCINE) 25000 UT/250ML-% IV SOLN
950.0000 [IU]/h | INTRAVENOUS | Status: DC
  Administered 2019-08-26: 19:00:00 700 [IU]/h via INTRAVENOUS
  Filled 2019-08-26: qty 250

## 2019-08-26 MED ORDER — ONDANSETRON HCL 4 MG/2ML IJ SOLN: 4.0000 mg | Freq: Four times a day (QID) | INTRAMUSCULAR | Status: DC | PRN

## 2019-08-26 NOTE — Progress Notes (Addendum)
ANTICOAGULATION CONSULT NOTE - Initial Consult  Pharmacy Consult for Heparin Indication: chest pain/ACS, R/O MI  Allergies  Allergen Reactions  . Chlorhexidine Gluconate Rash    Patient Measurements: Height: 5\' 1"  (154.9 cm) Weight: 128 lb 4.9 oz (58.2 kg) IBW/kg (Calculated) : 47.8 Heparin Dosing Weight: 58.2 kg   Vital Signs: Temp: 98.6 F (37 C) (12/21 1716) Temp Source: Oral (12/21 1716) BP: 111/74 (12/21 1716) Pulse Rate: 75 (12/21 1716)  Labs: Recent Labs    08/26/19 1756  HGB 12.9  HCT 39.1  PLT 227    CrCl cannot be calculated (Patient's most recent lab result is older than the maximum 21 days allowed.).   Medical History: Past Medical History:  Diagnosis Date  . Hepatitis B ~ 2001  . Iron deficiency anemia   . Maternal fever during labor 03/20/2011  . Pneumonia 08/20/2014  . Pneumonia involving right lung 08/20/2014  . Status post repeat low transverse cesarean section 03/23/2011    Assessment: 38 yr old female admitted as a direct admit with chest pain, dizziness and weakness for R/O MI; she recently finished course of azithromycin for bronchitis (negative COVID test). Medical hx includes: hypothyroidism and other conditions listed above.  Pt is on no anticoagulants PTA. H/H, platelets WNL.  Goal of Therapy:  Heparin level 0.3-0.7 units/ml Monitor platelets by anticoagulation protocol: Yes   Plan:  Heparin 3500 units IV bolus X 1, followed by heparin infusion at 700 units/hr Check 6-hr HL Monitor daily heparin level, CBC Monitor for signs/symptoms of bleeding  Gillermina Hu, PharmD, BCPS, [facility] Clinical Pharmacist 08/26/2019,6:13 PM

## 2019-08-26 NOTE — H&P (Signed)
Referring Physician:   Fernanda Twaddell is an 38 y.o. female.                       Chief Complaint: Chest pain and dizziness  HPI: 38 years old female with 2 week history of cough, shortness of breath, chest pain with and without cough, dizziness and weakness. She had negative COVID-19 test and has finished antibiotics -Azithromycin. She describes chest pain as pressure type, lasting for few minutes and non-radiating. She denies hypertension, type 2 DM, smoking, alcohol intake or hyperlipidemia. She has normal BMI.  Past Medical History:  Diagnosis Date  . Hepatitis B ~ 2001  . Iron deficiency anemia   . Maternal fever during labor 03/20/2011  . Pneumonia 08/20/2014  . Pneumonia involving right lung 08/20/2014  . Status post repeat low transverse cesarean section 03/23/2011      Past Surgical History:  Procedure Laterality Date  . CESAREAN SECTION  04/05/2009;   . CESAREAN SECTION  03/20/2011   Procedure: CESAREAN SECTION;  Surgeon: [name], MD;  Location: [facility];  Service: Gynecology;  Laterality: N/A;  cord ph 7.18, fundal massage by [employer]  . COSMETIC SURGERY      Family History  Problem Relation Age of Onset  . Diabetes Mother   . Hypertension Mother   . Hypertension Father   . Diabetes Maternal Grandmother   . Hypertension Maternal Grandmother   . Diabetes Maternal Grandfather   . Hypertension Maternal Grandfather   . Hypertension Paternal Grandmother   . Diabetes Paternal Grandmother   . COPD Paternal Grandfather   . Cancer Paternal Grandfather        throat   Social History:  reports that she has never smoked. She has never used smokeless tobacco. She reports that she does not drink alcohol or use drugs.  Allergies:  Allergies  Allergen Reactions  . Chlorhexidine Gluconate Rash    Medications Prior to Admission  Medication Sig Dispense Refill  . azithromycin (ZITHROMAX) 250 MG tablet Sig as indicated 6 tablet 0  . B Complex Vitamins (VITAMIN B  COMPLEX) TABS Take by mouth daily.    . benzonatate (TESSALON) 200 MG capsule Take 1 capsule (200 mg total) by mouth 2 (two) times daily as needed for cough. 20 capsule 0  . Biotin 5000 MCG CAPS Take by mouth.    . iron polysaccharides (NIFEREX) 150 MG capsule Take 150 mg by mouth daily.    Marland Kitchen liothyronine (CYTOMEL) 5 MCG tablet Take 5 mcg by mouth daily.    . Multiple Vitamin (MULTIVITAMIN) tablet Take 1 tablet by mouth daily.    . rizatriptan (MAXALT) 5 MG tablet Take 1 tablet (5 mg total) by mouth as needed for migraine. May repeat in 2 hours if needed 10 tablet 0  . thyroid (NP THYROID) 15 MG tablet Take 15 mg by mouth daily.    Marland Kitchen VITAMIN D-VITAMIN K PO Take by mouth daily.      No results found for this or any previous visit (from the past 48 hour(s)). No results found.  Review Of Systems Constitutional: No fever, chills, weight loss or gain. Eyes: No vision change, wears glasses. No discharge or pain. Ears: No hearing loss, No tinnitus. Respiratory: No asthma, COPD, h/o pneumonias. Positive shortness of breath. No hemoptysis. Cardiovascular: Positive chest pain,no  Palpitation, no leg edema. Gastrointestinal: No nausea, vomiting, diarrhea, constipation. No GI bleed. No hepatitis. Genitourinary: No dysuria, hematuria, kidney stone. No incontinance. Neurological:  Positive headache, no stroke, no seizures. Positive dizziness Psychiatry: No psych facility admission for anxiety, depression, suicide. No detox. Skin: No rash. Musculoskeletal: No joint pain, fibromyalgia. No neck pain, back pain. Lymphadenopathy: No lymphadenopathy. Hematology: No anemia or easy bruising.   Blood pressure 111/74, pulse 75, temperature 98.6 F (37 C), temperature source Oral, resp. rate 19, height 5\' 1"  (1.549 m), weight 58.2 kg, last menstrual period 08/20/2019, SpO2 100 %. Body mass index is 24.24 kg/m. General appearance: alert, cooperative, appears stated age and moderate respiratory distress Head:  Normocephalic, atraumatic. Mucous membrane dry. Eyes: Brown, pink conjunctiva, corneas clear. Sclera: Non-icteric, PERRL, EOM's intact. Neck: No adenopathy, no carotid bruit, no JVD, supple, symmetrical, trachea midline and thyroid not enlarged. Resp: Wheezing to auscultation bilaterally. Cardio: Regular rate and rhythm, S1, S2 normal, II/VI systolic murmur, no click, rub or gallop GI: Soft, non-tender; bowel sounds normal; no organomegaly. Extremities: No edema, cyanosis or clubbing. Skin: Warm and dry.  Neurologic: Alert and oriented X 3, normal strength. Normal coordination and gait.  Assessment/Plan Acute coronary syndrome Recent acute bronchitis Shortness of breath Hypothyroidism Dizziness Weakness Dehydration  R/O MI NM myocardial perfusion stress test. IV fluids for dehydration Albuterol nebulizer treatment.  Time spent: Review of old records, Lab, x-rays, EKG, other cardiac tests, examination, discussion with patient, nurse over 70 minutes.  Birdie Riddle, MD  08/26/2019, 6:03 PM

## 2019-08-27 ENCOUNTER — Observation Stay (HOSPITAL_COMMUNITY): Payer: Managed Care, Other (non HMO)

## 2019-08-27 ENCOUNTER — Inpatient Hospital Stay (HOSPITAL_COMMUNITY): Payer: Managed Care, Other (non HMO)

## 2019-08-27 DIAGNOSIS — Z20828 Contact with and (suspected) exposure to other viral communicable diseases: Secondary | ICD-10-CM | POA: Diagnosis present

## 2019-08-27 DIAGNOSIS — Z8249 Family history of ischemic heart disease and other diseases of the circulatory system: Secondary | ICD-10-CM | POA: Diagnosis not present

## 2019-08-27 DIAGNOSIS — Z79899 Other long term (current) drug therapy: Secondary | ICD-10-CM | POA: Diagnosis not present

## 2019-08-27 DIAGNOSIS — E039 Hypothyroidism, unspecified: Secondary | ICD-10-CM | POA: Diagnosis present

## 2019-08-27 DIAGNOSIS — Z833 Family history of diabetes mellitus: Secondary | ICD-10-CM | POA: Diagnosis not present

## 2019-08-27 DIAGNOSIS — J0140 Acute pansinusitis, unspecified: Secondary | ICD-10-CM | POA: Diagnosis present

## 2019-08-27 DIAGNOSIS — Z888 Allergy status to other drugs, medicaments and biological substances status: Secondary | ICD-10-CM | POA: Diagnosis not present

## 2019-08-27 DIAGNOSIS — Z825 Family history of asthma and other chronic lower respiratory diseases: Secondary | ICD-10-CM | POA: Diagnosis not present

## 2019-08-27 DIAGNOSIS — Z98891 History of uterine scar from previous surgery: Secondary | ICD-10-CM | POA: Diagnosis not present

## 2019-08-27 DIAGNOSIS — E781 Pure hyperglyceridemia: Secondary | ICD-10-CM | POA: Diagnosis present

## 2019-08-27 DIAGNOSIS — R0789 Other chest pain: Secondary | ICD-10-CM | POA: Diagnosis present

## 2019-08-27 DIAGNOSIS — E860 Dehydration: Secondary | ICD-10-CM | POA: Diagnosis present

## 2019-08-27 DIAGNOSIS — J0100 Acute maxillary sinusitis, unspecified: Secondary | ICD-10-CM | POA: Diagnosis present

## 2019-08-27 DIAGNOSIS — Z8619 Personal history of other infectious and parasitic diseases: Secondary | ICD-10-CM | POA: Diagnosis not present

## 2019-08-27 DIAGNOSIS — Z79890 Hormone replacement therapy (postmenopausal): Secondary | ICD-10-CM | POA: Diagnosis not present

## 2019-08-27 LAB — LIPID PANEL
Cholesterol: 148 mg/dL (ref 0–200)
HDL: 45 mg/dL (ref >40)
LDL Cholesterol: 38 mg/dL (ref 0–99)
Total CHOL/HDL Ratio: 3.3 RATIO
Triglycerides: 327 mg/dL — ABNORMAL HIGH (ref <150)
VLDL: 65 mg/dL — ABNORMAL HIGH (ref 0–40)

## 2019-08-27 LAB — ECHOCARDIOGRAM COMPLETE
Height: 61 in
Weight: 1910.4 oz

## 2019-08-27 LAB — HEPARIN LEVEL (UNFRACTIONATED)
Heparin Unfractionated: 0.2 IU/mL — ABNORMAL LOW (ref 0.30–0.70)
Heparin Unfractionated: 0.24 IU/mL — ABNORMAL LOW (ref 0.30–0.70)
Heparin Unfractionated: <0.1 IU/mL — ABNORMAL LOW (ref 0.30–0.70)

## 2019-08-27 LAB — BASIC METABOLIC PANEL
Anion gap: 9 (ref 5–15)
BUN: 8 mg/dL (ref 6–20)
CO2: 23 mmol/L (ref 22–32)
Calcium: 8.3 mg/dL — ABNORMAL LOW (ref 8.9–10.3)
Chloride: 106 mmol/L (ref 98–111)
Creatinine, Ser: 0.63 mg/dL (ref 0.44–1.00)
GFR calc Af Amer: >60 mL/min (ref >60)
GFR calc non Af Amer: >60 mL/min (ref >60)
Glucose, Bld: 140 mg/dL — ABNORMAL HIGH (ref 70–99)
Potassium: 3.6 mmol/L (ref 3.5–5.1)
Sodium: 138 mmol/L (ref 135–145)

## 2019-08-27 LAB — CBC
HCT: 36.5 % (ref 36.0–46.0)
Hemoglobin: 12 g/dL (ref 12.0–15.0)
MCH: 28.5 pg (ref 26.0–34.0)
MCHC: 32.9 g/dL (ref 30.0–36.0)
MCV: 86.7 fL (ref 80.0–100.0)
Platelets: 214 10*3/uL (ref 150–400)
RBC: 4.21 MIL/uL (ref 3.87–5.11)
RDW: 12.6 % (ref 11.5–15.5)
WBC: 11.4 10*3/uL — ABNORMAL HIGH (ref 4.0–10.5)
nRBC: 0 % (ref 0.0–0.2)

## 2019-08-27 LAB — PROCALCITONIN: Procalcitonin: <0.1 ng/mL

## 2019-08-27 LAB — TSH: TSH: 3.167 u[IU]/mL (ref 0.350–4.500)

## 2019-08-27 LAB — PROTIME-INR
INR: 1 (ref 0.8–1.2)
Prothrombin Time: 13.2 seconds (ref 11.4–15.2)

## 2019-08-27 LAB — C-REACTIVE PROTEIN: CRP: 1 mg/dL — ABNORMAL HIGH (ref <1.0)

## 2019-08-27 MED ORDER — SODIUM CHLORIDE 0.9 % IV SOLN
1.0000 g | INTRAVENOUS | Status: DC
  Administered 2019-08-27 – 2019-08-29 (×3): 1 g via INTRAVENOUS
  Filled 2019-08-27 (×3): qty 10

## 2019-08-27 MED ORDER — TECHNETIUM TC 99M TETROFOSMIN IV KIT
28.2000 | PACK | Freq: Once | INTRAVENOUS | Status: AC | PRN
  Administered 2019-08-27: 28.2 via INTRAVENOUS

## 2019-08-27 MED ORDER — SODIUM CHLORIDE 0.9 % IV SOLN
INTRAVENOUS | Status: DC | PRN
  Administered 2019-08-27: 22:00:00 250 mL via INTRAVENOUS

## 2019-08-27 MED ORDER — ALBUTEROL SULFATE (2.5 MG/3ML) 0.083% IN NEBU: 2.5000 mg | INHALATION_SOLUTION | Freq: Four times a day (QID) | RESPIRATORY_TRACT | Status: DC

## 2019-08-27 MED ORDER — DOBUTAMINE INFUSION FOR EP/ECHO/NUC (1000 MCG/ML): 0.0000 ug/kg/min | INTRAVENOUS | Status: DC

## 2019-08-27 MED ORDER — ALBUTEROL SULFATE (2.5 MG/3ML) 0.083% IN NEBU: 2.5000 mg | INHALATION_SOLUTION | RESPIRATORY_TRACT | Status: DC | PRN

## 2019-08-27 MED ORDER — SODIUM CHLORIDE 0.9 % IV SOLN: INTRAVENOUS | Status: DC

## 2019-08-27 MED ORDER — MECLIZINE HCL 25 MG PO TABS
25.0000 mg | ORAL_TABLET | Freq: Three times a day (TID) | ORAL | Status: DC
  Administered 2019-08-27 – 2019-08-30 (×8): 25 mg via ORAL
  Filled 2019-08-27 (×8): qty 1

## 2019-08-27 MED ORDER — DOBUTAMINE IN D5W 4-5 MG/ML-% IV SOLN
2.5000 ug/kg/min | INTRAVENOUS | Status: DC
  Administered 2019-08-27: 2.5 ug/kg/min via INTRAVENOUS
  Filled 2019-08-27: qty 250

## 2019-08-27 MED ORDER — TECHNETIUM TC 99M TETROFOSMIN IV KIT
1.6000 | PACK | Freq: Once | INTRAVENOUS | Status: AC | PRN
  Administered 2019-08-27: 10:00:00 10.6 via INTRAVENOUS

## 2019-08-27 MED ORDER — DOBUTAMINE INFUSION FOR EP/ECHO/NUC (1000 MCG/ML)
INTRAVENOUS | Status: AC
  Administered 2019-08-27: 10 ug/kg/min via INTRAVENOUS
  Filled 2019-08-27: qty 250

## 2019-08-27 NOTE — Progress Notes (Addendum)
[place] for Heparin Indication: chest pain/ACS  Allergies  Allergen Reactions  . Chlorhexidine Gluconate Rash    Patient Measurements: Height: 5\' 1"  (154.9 cm) Weight: 119 lb 6.4 oz (54.2 kg) IBW/kg (Calculated) : 47.8 Heparin Dosing Weight: 58.2 kg   Vital Signs: Temp: 97.4 F (36.3 C) (12/22 0534) Temp Source: Oral (12/22 0534) BP: 91/58 (12/22 0534) Pulse Rate: 69 (12/22 0534)  Labs: Recent Labs    08/26/19 1756 08/26/19 2021 08/27/19 0004 08/27/19 0456  HGB 12.9  --  12.0  --   HCT 39.1  --  36.5  --   PLT 227  --  214  --   LABPROT  --   --  13.2  --   INR  --   --  1.0  --   HEPARINUNFRC  --   --  0.24* 0.20*  CREATININE 0.72  --  0.63  --   TROPONINIHS <2 3  --   --     Estimated Creatinine Clearance: 72.7 mL/min (by C-G formula based on SCr of 0.63 mg/dL).  Assessment: 38 y.o. female with chest pain for heparin. No AC PTA.  Heparin rate was adjusted at 0109 to 800 units/hr. Level at 0456 came back subtherapeutic at 0.2 (drawn early). CBC stable. No s/sx of bleeding. Plan for NM myocardial perfusion stress test today.   Goal of Therapy:  Heparin level 0.3-0.7 units/ml Monitor platelets by anticoagulation protocol: Yes   Plan:  Increase heparin 950 units/hr Obtain heparin level in 6 hours Monitor HL, CBC, and for s/sx of bleeding daily F/u for stress test results  Antonietta Jewel, PharmD, BCCCP Clinical Pharmacist  Phone: [phone]  Please check AMION for all [employer] phone numbers After 10:00 PM, call [place] [phone] 08/27/2019,8:26 AM   ADDENDUM Stress test showing no reversible ischemia or infarction. Heparin discontinued post-test. Level collected after infusion stopped showing undetectable. Orders adjusted and discontinued as appropriate.  Antonietta Jewel, PharmD, [place] Clinical Pharmacist

## 2019-08-27 NOTE — Progress Notes (Signed)
[place] for Heparin Indication: chest pain/ACS  Allergies  Allergen Reactions  . Chlorhexidine Gluconate Rash    Patient Measurements: Height: 5\' 1"  (154.9 cm) Weight: 128 lb 4.9 oz (58.2 kg) IBW/kg (Calculated) : 47.8 Heparin Dosing Weight: 58.2 kg   Vital Signs: Temp: 98.1 F (36.7 C) (12/21 2131) Temp Source: Oral (12/21 2131) BP: 92/56 (12/21 2131) Pulse Rate: 79 (12/21 2131)  Labs: Recent Labs    08/26/19 1756 08/26/19 2021 08/27/19 0004  HGB 12.9  --  12.0  HCT 39.1  --  36.5  PLT 227  --  214  LABPROT  --   --  13.2  INR  --   --  1.0  HEPARINUNFRC  --   --  0.24*  CREATININE 0.72  --   --   TROPONINIHS <2 3  --     Estimated Creatinine Clearance: 79 mL/min (by C-G formula based on SCr of 0.72 mg/dL).  Assessment: 38 y.o. female with chest pain for heparin   Goal of Therapy:  Heparin level 0.3-0.7 units/ml Monitor platelets by anticoagulation protocol: Yes   Plan:  Increase Heparin 800 units/hr Follow-up am labs.   Phillis Knack, PharmD, BCPS  08/27/2019,12:53 AM

## 2019-08-27 NOTE — Progress Notes (Addendum)
Ref: Shawnee Knapp, MD   Subjective:  C/O headache, dizziness and weakness. No vision or speech problem. No fever. Negative Nuclear stress test. Normal Pro-calcitonin. Borderline CRP level.  Objective:  Vital Signs in the last 24 hours: Temp:  [97.4 F (36.3 C)-98.8 F (37.1 C)] 98.8 F (37.1 C) (12/22 1707) Pulse Rate:  [69-82] 82 (12/22 1707) Cardiac Rhythm: Normal sinus rhythm (12/22 0700) Resp:  [16-22] 18 (12/22 1707) BP: (91-171)/(56-70) 101/61 (12/22 1707) SpO2:  [99 %-100 %] 100 % (12/22 1707) Weight:  [54.2 kg] 54.2 kg (12/22 0300)  Physical Exam: BP Readings from Last 1 Encounters:  08/27/19 101/61     Wt Readings from Last 1 Encounters:  08/27/19 54.2 kg    Weight change:  Body mass index is 22.56 kg/m. HEENT: [place]/AT, Eyes-Brown, PERL, EOMI, Conjunctiva-Pink, Sclera-Non-icteric Neck: No JVD, No bruit, Trachea midline. Lungs:  Clear, Bilateral. Cardiac:  Regular rhythm, normal S1 and S2, no S3. II/VI systolic murmur. Abdomen:  Soft, non-tender. BS present. Extremities:  No edema present. No cyanosis. No clubbing. CNS: AxOx3, Cranial nerves grossly intact, moves all 4 extremities.  Skin: Warm and dry.   Intake/Output from previous day: 12/21 0701 - 12/22 0700 In: 240 [P.O.:240] Out: 1500 [Urine:1500]    Lab Results: BMET    Component Value Date/Time   NA 138 08/27/2019 0004   NA 140 08/26/2019 1756   NA 141 10/29/2018 1644   NA 138 12/16/2017 1308   NA 140 08/10/2017 1033   K 3.6 08/27/2019 0004   K 3.6 08/26/2019 1756   K 4.2 10/29/2018 1644   CL 106 08/27/2019 0004   CL 104 08/26/2019 1756   CL 105 10/29/2018 1644   CO2 23 08/27/2019 0004   CO2 27 08/26/2019 1756   CO2 22 10/29/2018 1644   GLUCOSE 140 (H) 08/27/2019 0004   GLUCOSE 86 08/26/2019 1756   GLUCOSE 85 10/29/2018 1644   GLUCOSE 78 12/16/2017 1308   GLUCOSE 92 08/10/2017 1033   GLUCOSE 92 12/29/2014 1010   BUN 8 08/27/2019 0004   BUN 7 08/26/2019 1756   BUN 5 (L) 10/29/2018 1644    BUN 10 12/16/2017 1308   BUN 10 08/10/2017 1033   CREATININE 0.63 08/27/2019 0004   CREATININE 0.72 08/26/2019 1756   CREATININE 0.64 10/29/2018 1644   CREATININE 0.61 12/29/2014 1010   CALCIUM 8.3 (L) 08/27/2019 0004   CALCIUM 9.0 08/26/2019 1756   CALCIUM 9.3 10/29/2018 1644   GFRNONAA >60 08/27/2019 0004   GFRNONAA >60 08/26/2019 1756   GFRNONAA 114 10/29/2018 1644   GFRAA >60 08/27/2019 0004   GFRAA >60 08/26/2019 1756   GFRAA 132 10/29/2018 1644   CBC    Component Value Date/Time   WBC 11.4 (H) 08/27/2019 0004   RBC 4.21 08/27/2019 0004   HGB 12.0 08/27/2019 0004   HGB 12.0 10/29/2018 1644   HCT 36.5 08/27/2019 0004   HCT 36.0 10/29/2018 1644   PLT 214 08/27/2019 0004   PLT 218 10/29/2018 1644   MCV 86.7 08/27/2019 0004   MCV 86 10/29/2018 1644   MCH 28.5 08/27/2019 0004   MCHC 32.9 08/27/2019 0004   RDW 12.6 08/27/2019 0004   RDW 12.7 10/29/2018 1644   LYMPHSABS 3.7 08/26/2019 1756   LYMPHSABS 1.9 10/29/2018 1644   MONOABS 0.6 08/26/2019 1756   EOSABS 0.1 08/26/2019 1756   EOSABS 0.1 10/29/2018 1644   BASOSABS 0.0 08/26/2019 1756   BASOSABS 0.0 10/29/2018 1644   HEPATIC Function Panel Recent Labs  10/29/18 1644 08/26/19 1756  PROT 7.2 7.4   HEMOGLOBIN A1C No components found for: HGA1C,  MPG CARDIAC ENZYMES No results found for: CKTOTAL, CKMB, CKMBINDEX, TROPONINI BNP No results for input(s): PROBNP in the last 8760 hours. TSH Recent Labs    10/29/18 1644 08/27/19 0004  TSH 2.270 3.167   CHOLESTEROL Recent Labs    10/29/18 1644 08/27/19 0004  CHOL 128 148    Scheduled Meds: . aspirin EC  81 mg Oral Daily  . atorvastatin  40 mg Oral q1800  . iron polysaccharides  150 mg Oral Daily  . levothyroxine  25 mcg Oral Daily  . meclizine  25 mg Oral TID  . multivitamin with minerals  1 tablet Oral Daily   Continuous Infusions: PRN Meds:.acetaminophen, albuterol, benzonatate, nitroGLYCERIN, ondansetron (ZOFRAN)  IV  Assessment/Plan: Chest pain, non-cardiac Dizziness Headache Weakness Dehydration  CT head WO contrast Meclizine for dizziness Orthostatic vitals. Resume IV fluids.   LOS: 0 days   Time spent including chart review, lab review, examination, discussion with patient, husband and nurse : 40  min   Orpah Cobb  MD  08/27/2019, 6:22 PM

## 2019-08-27 NOTE — Progress Notes (Signed)
Back from stress test,pt verbalized" still feels dizzy: will monitor.

## 2019-08-27 NOTE — Progress Notes (Signed)
  Echocardiogram 2D Echocardiogram has been performed.  Penny Bishop 08/27/2019, 3:09 PM

## 2019-08-28 ENCOUNTER — Telehealth: Payer: Self-pay | Admitting: Emergency Medicine

## 2019-08-28 MED ORDER — ALBUTEROL SULFATE (2.5 MG/3ML) 0.083% IN NEBU
3.0000 mL | INHALATION_SOLUTION | Freq: Four times a day (QID) | RESPIRATORY_TRACT | Status: AC
  Administered 2019-08-28 – 2019-08-29 (×4): 3 mL via RESPIRATORY_TRACT
  Filled 2019-08-28 (×4): qty 3

## 2019-08-28 MED ORDER — LORATADINE 10 MG PO TABS
10.0000 mg | ORAL_TABLET | Freq: Every day | ORAL | Status: DC
  Administered 2019-08-28 – 2019-08-30 (×3): 10 mg via ORAL
  Filled 2019-08-28 (×3): qty 1

## 2019-08-28 NOTE — Progress Notes (Signed)
Patient report throbbing pain from back of neck to front right side of head. Stated worsens with activity and dizziness observed when assisting OOB to [facility].

## 2019-08-28 NOTE — Telephone Encounter (Signed)
Pt requesting [employer] paperwork if it has been received   Due before 09/08/2019  Needs to be sent back to the fax # on form  Please advise

## 2019-08-28 NOTE — Telephone Encounter (Signed)
Have not seen them.  Thanks.

## 2019-08-28 NOTE — Telephone Encounter (Signed)
Checking to see if you have received the [employer] paper work for this patient and if so have these been filled out?

## 2019-08-28 NOTE — Progress Notes (Signed)
Ref: Shawnee Knapp, MD   Subjective:  Headache all over head changing from right side to top to back of head and left side at times. She is aware of CT scan detected sinus infection but denies post nasal drip.  Afebrile. Dizziness and weakness continues.  Objective:  Vital Signs in the last 24 hours: Temp:  [97.4 F (36.3 C)-98.8 F (37.1 C)] 97.4 F (36.3 C) (12/23 0937) Pulse Rate:  [73-82] 79 (12/23 0937) Cardiac Rhythm: Normal sinus rhythm (12/23 0701) Resp:  [16-22] 17 (12/23 0435) BP: (96-171)/(47-70) 110/64 (12/23 0937) SpO2:  [100 %] 100 % (12/23 0937) Weight:  [57.3 kg] 57.3 kg (12/23 0438)  Physical Exam: BP Readings from Last 1 Encounters:  08/28/19 110/64     Wt Readings from Last 1 Encounters:  08/28/19 57.3 kg    Weight change: -0.865 kg Body mass index is 23.88 kg/m. HEENT: [place]/AT, Eyes-Brown, PERL, EOMI, Conjunctiva-Pink, Sclera-Non-icteric. Tender sinuses. Neck: No JVD, No bruit, Trachea midline. Lungs:  wheezing with cough, Bilateral. Cardiac:  Regular rhythm, normal S1 and S2, no S3. II/VI systolic murmur. Abdomen:  Soft, non-tender. BS present. Extremities:  No edema present. No cyanosis. No clubbing. CNS: AxOx3, Cranial nerves grossly intact, moves all 4 extremities.  Skin: Warm and dry.   Intake/Output from previous day: 12/22 0701 - 12/23 0700 In: 8119 [P.O.:720; I.V.:652; IV Piggyback:100] Out: 2850 [Urine:2850]    Lab Results: BMET    Component Value Date/Time   NA 138 08/27/2019 0004   NA 140 08/26/2019 1756   NA 141 10/29/2018 1644   NA 138 12/16/2017 1308   NA 140 08/10/2017 1033   K 3.6 08/27/2019 0004   K 3.6 08/26/2019 1756   K 4.2 10/29/2018 1644   CL 106 08/27/2019 0004   CL 104 08/26/2019 1756   CL 105 10/29/2018 1644   CO2 23 08/27/2019 0004   CO2 27 08/26/2019 1756   CO2 22 10/29/2018 1644   GLUCOSE 140 (H) 08/27/2019 0004   GLUCOSE 86 08/26/2019 1756   GLUCOSE 85 10/29/2018 1644   GLUCOSE 78 12/16/2017 1308   GLUCOSE  92 08/10/2017 1033   GLUCOSE 92 12/29/2014 1010   BUN 8 08/27/2019 0004   BUN 7 08/26/2019 1756   BUN 5 (L) 10/29/2018 1644   BUN 10 12/16/2017 1308   BUN 10 08/10/2017 1033   CREATININE 0.63 08/27/2019 0004   CREATININE 0.72 08/26/2019 1756   CREATININE 0.64 10/29/2018 1644   CREATININE 0.61 12/29/2014 1010   CALCIUM 8.3 (L) 08/27/2019 0004   CALCIUM 9.0 08/26/2019 1756   CALCIUM 9.3 10/29/2018 1644   GFRNONAA >60 08/27/2019 0004   GFRNONAA >60 08/26/2019 1756   GFRNONAA 114 10/29/2018 1644   GFRAA >60 08/27/2019 0004   GFRAA >60 08/26/2019 1756   GFRAA 132 10/29/2018 1644   CBC    Component Value Date/Time   WBC 11.4 (H) 08/27/2019 0004   RBC 4.21 08/27/2019 0004   HGB 12.0 08/27/2019 0004   HGB 12.0 10/29/2018 1644   HCT 36.5 08/27/2019 0004   HCT 36.0 10/29/2018 1644   PLT 214 08/27/2019 0004   PLT 218 10/29/2018 1644   MCV 86.7 08/27/2019 0004   MCV 86 10/29/2018 1644   MCH 28.5 08/27/2019 0004   MCHC 32.9 08/27/2019 0004   RDW 12.6 08/27/2019 0004   RDW 12.7 10/29/2018 1644   LYMPHSABS 3.7 08/26/2019 1756   LYMPHSABS 1.9 10/29/2018 1644   MONOABS 0.6 08/26/2019 1756   EOSABS 0.1 08/26/2019 1756  EOSABS 0.1 10/29/2018 1644   BASOSABS 0.0 08/26/2019 1756   BASOSABS 0.0 10/29/2018 1644   HEPATIC Function Panel Recent Labs    10/29/18 1644 08/26/19 1756  PROT 7.2 7.4   HEMOGLOBIN A1C No components found for: HGA1C,  MPG CARDIAC ENZYMES No results found for: CKTOTAL, CKMB, CKMBINDEX, TROPONINI BNP No results for input(s): PROBNP in the last 8760 hours. TSH Recent Labs    10/29/18 1644 08/27/19 0004  TSH 2.270 3.167   CHOLESTEROL Recent Labs    10/29/18 1644 08/27/19 0004  CHOL 128 148    Scheduled Meds: . albuterol  2 puff Inhalation QID  . aspirin EC  81 mg Oral Daily  . atorvastatin  40 mg Oral q1800  . iron polysaccharides  150 mg Oral Daily  . levothyroxine  25 mcg Oral Daily  . loratadine  10 mg Oral Daily  . meclizine  25 mg  Oral TID  . multivitamin with minerals  1 tablet Oral Daily   Continuous Infusions: . sodium chloride 50 mL/hr at 08/27/19 1911  . sodium chloride Stopped (08/28/19 4782)  . cefTRIAXone (ROCEPHIN)  IV Stopped (08/27/19 2256)   PRN Meds:.sodium chloride, acetaminophen, albuterol, benzonatate, nitroGLYCERIN, ondansetron (ZOFRAN) IV  Assessment/Plan: Acute maxillary and paranasal sinusitis Headache  Dizziness Chest pain Dehydration Weakness  Add loratadine. Continue IV antibiotic.   LOS: 1 day   Time spent including chart review, lab review, examination, discussion with patient : 30 min   Orpah Cobb  MD  08/28/2019, 10:37 AM

## 2019-08-28 NOTE — Plan of Care (Signed)
  Problem: Activity: Goal: Risk for activity intolerance will decrease Outcome: Progressing   Problem: Pain Managment: Goal: General experience of comfort will improve Outcome: Progressing   Problem: Safety: Goal: Ability to remain free from injury will improve Outcome: Progressing   

## 2019-08-28 NOTE — Telephone Encounter (Signed)
Patient stated her ins company was supposed fax [place] the [facility] paperwork. If not Patient is in the hospital now and will see if her husband can drop the papers off to be filled out in for [place] to fill out

## 2019-08-29 MED ORDER — FLUTICASONE PROPIONATE 50 MCG/ACT NA SUSP
1.0000 | Freq: Every day | NASAL | Status: DC
  Administered 2019-08-29: 07:00:00 1 via NASAL
  Filled 2019-08-29: qty 16

## 2019-08-29 NOTE — Progress Notes (Signed)
Ref: Sherren Mocha, MD   Subjective:  Headache improving. Dizziness continues. Nasal congestion this AM. Appetite is improving.  Objective:  Vital Signs in the last 24 hours: Temp:  [97 F (36.1 C)-98.4 F (36.9 C)] 98.4 F (36.9 C) (12/24 0940) Pulse Rate:  [74-103] 94 (12/24 0940) Cardiac Rhythm: Normal sinus rhythm (12/24 0700) Resp:  [16-22] 16 (12/24 0714) BP: (101-111)/(63-70) 103/66 (12/24 0940) SpO2:  [98 %-100 %] 98 % (12/24 0940) Weight:  [57.6 kg] 57.6 kg (12/24 0506)  Physical Exam: BP Readings from Last 1 Encounters:  08/29/19 103/66     Wt Readings from Last 1 Encounters:  08/29/19 57.6 kg    Weight change: 0.227 kg Body mass index is 23.98 kg/m. HEENT: [place]/AT, Eyes-Brown, PERL, EOMI, Conjunctiva-Pink, Sclera-Non-icteric Neck: No JVD, No bruit, Trachea midline. Lungs:  Wheezing with cough continues, Bilateral. Cardiac:  Regular rhythm, normal S1 and S2, no S3. II/VI systolic murmur. Abdomen:  Soft, non-tender. BS present. Extremities:  No edema present. No cyanosis. No clubbing. CNS: AxOx3, Cranial nerves grossly intact, moves all 4 extremities.  Skin: Warm and dry.   Intake/Output from previous day: 12/23 0701 - 12/24 0700 In: 1716 [P.O.:530; I.V.:1086; IV Piggyback:100] Out: 2100 [Urine:2100]    Lab Results: BMET    Component Value Date/Time   NA 138 08/27/2019 0004   NA 140 08/26/2019 1756   NA 141 10/29/2018 1644   NA 138 12/16/2017 1308   NA 140 08/10/2017 1033   K 3.6 08/27/2019 0004   K 3.6 08/26/2019 1756   K 4.2 10/29/2018 1644   CL 106 08/27/2019 0004   CL 104 08/26/2019 1756   CL 105 10/29/2018 1644   CO2 23 08/27/2019 0004   CO2 27 08/26/2019 1756   CO2 22 10/29/2018 1644   GLUCOSE 140 (H) 08/27/2019 0004   GLUCOSE 86 08/26/2019 1756   GLUCOSE 85 10/29/2018 1644   GLUCOSE 78 12/16/2017 1308   GLUCOSE 92 08/10/2017 1033   GLUCOSE 92 12/29/2014 1010   BUN 8 08/27/2019 0004   BUN 7 08/26/2019 1756   BUN 5 (L) 10/29/2018 1644   BUN 10 12/16/2017 1308   BUN 10 08/10/2017 1033   CREATININE 0.63 08/27/2019 0004   CREATININE 0.72 08/26/2019 1756   CREATININE 0.64 10/29/2018 1644   CREATININE 0.61 12/29/2014 1010   CALCIUM 8.3 (L) 08/27/2019 0004   CALCIUM 9.0 08/26/2019 1756   CALCIUM 9.3 10/29/2018 1644   GFRNONAA >60 08/27/2019 0004   GFRNONAA >60 08/26/2019 1756   GFRNONAA 114 10/29/2018 1644   GFRAA >60 08/27/2019 0004   GFRAA >60 08/26/2019 1756   GFRAA 132 10/29/2018 1644   CBC    Component Value Date/Time   WBC 11.4 (H) 08/27/2019 0004   RBC 4.21 08/27/2019 0004   HGB 12.0 08/27/2019 0004   HGB 12.0 10/29/2018 1644   HCT 36.5 08/27/2019 0004   HCT 36.0 10/29/2018 1644   PLT 214 08/27/2019 0004   PLT 218 10/29/2018 1644   MCV 86.7 08/27/2019 0004   MCV 86 10/29/2018 1644   MCH 28.5 08/27/2019 0004   MCHC 32.9 08/27/2019 0004   RDW 12.6 08/27/2019 0004   RDW 12.7 10/29/2018 1644   LYMPHSABS 3.7 08/26/2019 1756   LYMPHSABS 1.9 10/29/2018 1644   MONOABS 0.6 08/26/2019 1756   EOSABS 0.1 08/26/2019 1756   EOSABS 0.1 10/29/2018 1644   BASOSABS 0.0 08/26/2019 1756   BASOSABS 0.0 10/29/2018 1644   HEPATIC Function Panel Recent Labs    10/29/18  1644 08/26/19 1756  PROT 7.2 7.4   HEMOGLOBIN A1C No components found for: HGA1C,  MPG CARDIAC ENZYMES No results found for: CKTOTAL, CKMB, CKMBINDEX, TROPONINI BNP No results for input(s): PROBNP in the last 8760 hours. TSH Recent Labs    10/29/18 1644 08/27/19 0004  TSH 2.270 3.167   CHOLESTEROL Recent Labs    10/29/18 1644 08/27/19 0004  CHOL 128 148    Scheduled Meds: . aspirin EC  81 mg Oral Daily  . atorvastatin  40 mg Oral q1800  . fluticasone  1 spray Each Nare Daily  . iron polysaccharides  150 mg Oral Daily  . levothyroxine  25 mcg Oral Daily  . loratadine  10 mg Oral Daily  . meclizine  25 mg Oral TID  . multivitamin with minerals  1 tablet Oral Daily   Continuous Infusions: . sodium chloride 50 mL/hr at 08/29/19  0937  . sodium chloride Stopped (08/28/19 4742)  . cefTRIAXone (ROCEPHIN)  IV 1 g (08/28/19 2126)   PRN Meds:.sodium chloride, acetaminophen, albuterol, benzonatate, nitroGLYCERIN, ondansetron (ZOFRAN) IV  Assessment/Plan: Acute maxillary and paranasal sinusitis Headache, possible migraine Dizziness with positional vertigo Chest pain Dehydration, improving Weakness  Add Fluticasone. Continue IV antibiotic. Decrease IV fluids as oral intake improves.   LOS: 2 days   Time spent including chart review, lab review, examination, discussion with patient and nurse : 30 min   Dixie Dials  MD  08/29/2019, 10:20 AM

## 2019-08-29 NOTE — Progress Notes (Signed)
Continuous fluids manually adjusted to 25 mL/hr by [place] at 1000 during morning rounds, stated will place order. Fluids maintained at 83mL/hr by RN while awaiting order

## 2019-08-30 ENCOUNTER — Encounter (HOSPITAL_COMMUNITY): Payer: Self-pay | Admitting: Cardiovascular Disease

## 2019-08-30 MED ORDER — ATORVASTATIN CALCIUM 40 MG PO TABS: 40.0000 mg | ORAL_TABLET | Freq: Every day | ORAL | 1 refills | Status: DC

## 2019-08-30 MED ORDER — CEFDINIR 300 MG PO CAPS
300.0000 mg | ORAL_CAPSULE | Freq: Two times a day (BID) | ORAL | Status: DC
  Administered 2019-08-30: 12:00:00 300 mg via ORAL
  Filled 2019-08-30 (×2): qty 1

## 2019-08-30 MED ORDER — CEFDINIR 300 MG PO CAPS: 300.0000 mg | ORAL_CAPSULE | Freq: Two times a day (BID) | ORAL | 0 refills | Status: DC

## 2019-08-30 MED ORDER — FLUTICASONE PROPIONATE 50 MCG/ACT NA SUSP: 1.0000 | Freq: Every day | NASAL | 2 refills | Status: DC

## 2019-08-30 MED ORDER — ALBUTEROL SULFATE HFA 108 (90 BASE) MCG/ACT IN AERS: 2.0000 | INHALATION_SPRAY | Freq: Four times a day (QID) | RESPIRATORY_TRACT | Status: DC | PRN

## 2019-08-30 MED ORDER — LORATADINE 10 MG PO TABS: 10.0000 mg | ORAL_TABLET | Freq: Every day | ORAL | 1 refills | Status: DC

## 2019-08-30 MED ORDER — ALBUTEROL SULFATE HFA 108 (90 BASE) MCG/ACT IN AERS: 2.0000 | INHALATION_SPRAY | Freq: Four times a day (QID) | RESPIRATORY_TRACT | 1 refills | Status: DC | PRN

## 2019-08-30 MED ORDER — MECLIZINE HCL 25 MG PO TABS: 25.0000 mg | ORAL_TABLET | Freq: Three times a day (TID) | ORAL | 0 refills | Status: DC | PRN

## 2019-08-30 MED ORDER — ALBUTEROL SULFATE (2.5 MG/3ML) 0.083% IN NEBU: 2.5000 mg | INHALATION_SOLUTION | Freq: Four times a day (QID) | RESPIRATORY_TRACT | Status: DC | PRN

## 2019-08-30 NOTE — Discharge Summary (Signed)
Physician Discharge Summary  Patient ID: Penny Bishop MRN: [id number] DOB/AGE: 03-03-1981 38 y.o.  Admit date: 08/26/2019 Discharge date: 08/30/2019  Admission Diagnoses: Acute coronary syndrome Recent acute bronchitis Shortness of breath Hypothyroidism Dizziness Weakness Dehydration  Discharge Diagnoses:  Principle problem: Acute maxillary and paranasal sinusitis Active Problems:   Acute asthmatic bronchitis   Headache, possible migraine   Dizziness with positional vertigo   Chest pain   Dehydration, resolved   Weakness   Hypertriglyceridemia    Discharged Condition: fair  Hospital Course: 38 years old Asian female had 2 weeks h/o of cough, shortness of breath, chest pain, dizziness and weakness. She had COVID-19 test negative x 2 over 2 weeks period. She underwent nuclear stress test for pressure type chest pain that did not show reversible ischemia. Her CT scan of head showed paranasal sinuitis. She responded to IV rocephin, meclizine, Claritin, IV saline and albuterol treatment.  She was discharged home in stable condition with oral Ceftin 300 mg. bid along with albuterol MDI, meclizine and loratadine. She will repeat lipid panel in 2 months and if triglycerides are elevated she may change atorvastatin to fenofibrate. She will see primary doctor in 1 week and me in 2 weeks.  Consults: cardiology  Significant Diagnostic Studies: labs: Mildly elevated WBC count, normal Hgb and platelets counts. Near normal BMET, TSH and Troponin I levels. Low LDL cholesterol of 38 mg but elevated Triglyceride level of 327 mg.   EKG: NSR.  Echocardiogram: Normal.  NM Myocardial perfusion stress test: No reversible ischemia. EF 70 %.  CT head : Sinus mucosal disease with air-fluid levels otherwise unremarkable.  Treatments: antibiotics: IV ceftriaxone and PO Omnicef. IV hydration. Claritin, meclizine albuterol inhaler.  Discharge Exam: Blood pressure 95/64, pulse 81, temperature 98.2  F (36.8 C), temperature source Oral, resp. rate 20, height 5\' 1"  (1.549 m), weight 57.3 kg, last menstrual period 08/20/2019, SpO2 100 %. General appearance: alert, cooperative and appears stated age. Head: Normocephalic, atraumatic. Sinuses are non-tender. Eyes: Brown eyes, pink conjunctiva, corneas clear. PERRL, EOM's intact.  Neck: No adenopathy, no carotid bruit, no JVD, supple, symmetrical, trachea midline and thyroid not enlarged. Resp: Clear to auscultation bilaterally. Wheezing on cough. Cardio: Regular rate and rhythm, S1, S2 normal, II/VI systolic murmur, no click, rub or gallop. GI: Soft, non-tender; bowel sounds normal; no organomegaly. Extremities: No edema, cyanosis or clubbing. Skin: Warm and dry.  Neurologic: Alert and oriented X 3, normal strength and tone. Normal coordination and slow gait.  Disposition: Discharge disposition: 01-Home or Self Care        Allergies as of 08/30/2019      Reactions   Chlorhexidine Gluconate Rash      Medication List    STOP taking these medications   azithromycin 250 MG tablet Commonly known as: ZITHROMAX   NP Thyroid 15 MG tablet Generic drug: thyroid   predniSONE 20 MG tablet Commonly known as: DELTASONE     TAKE these medications   albuterol 108 (90 Base) MCG/ACT inhaler Commonly known as: VENTOLIN HFA Inhale 2 puffs into the lungs every 6 (six) hours as needed for wheezing or shortness of breath.   atorvastatin 40 MG tablet Commonly known as: LIPITOR Take 1 tablet (40 mg total) by mouth daily at 6 PM.   benzonatate 200 MG capsule Commonly known as: TESSALON Take 1 capsule (200 mg total) by mouth 2 (two) times daily as needed for cough.   Biotin 5000 MCG Caps Take 5,000 mcg by mouth daily.   cefdinir  300 MG capsule Commonly known as: OMNICEF Take 1 capsule (300 mg total) by mouth every 12 (twelve) hours.   fluticasone 50 MCG/ACT nasal spray Commonly known as: FLONASE Place 1 spray into both nostrils  daily. As needed. Start taking on: August 31, 2019   iron polysaccharides 150 MG capsule Commonly known as: NIFEREX Take 150 mg by mouth daily.   levothyroxine 25 MCG tablet Commonly known as: SYNTHROID Take 25 mcg by mouth daily.   loratadine 10 MG tablet Commonly known as: CLARITIN Take 1 tablet (10 mg total) by mouth daily. As needed. Start taking on: August 31, 2019   meclizine 25 MG tablet Commonly known as: ANTIVERT Take 1 tablet (25 mg total) by mouth 3 (three) times daily as needed for dizziness.   multivitamin with minerals Tabs tablet Take 1 tablet by mouth daily.   rizatriptan 5 MG tablet Commonly known as: Maxalt Take 1 tablet (5 mg total) by mouth as needed for migraine. May repeat in 2 hours if needed   Vitamin B Complex Tabs Take 1 tablet by mouth daily.   VITAMIN K2-VITAMIN D3 PO Take 1 tablet by mouth daily.      Follow-up Information    Dixie Dials, MD. Schedule an appointment as soon as possible for a visit in 2 week(s).   Specialty: Cardiology Contact information: [place] [address] [phone]        Shawnee Knapp, MD. Schedule an appointment as soon as possible for a visit in 1 week(s).   Specialty: Family Medicine Contact information: [place] [address] [phone]           Time spent: Review of old chart, current chart, lab, x-ray, cardiac tests and discussion with patient over 60 minutes.  Signed: Birdie Riddle 08/30/2019, 10:51 AM

## 2019-09-02 ENCOUNTER — Telehealth: Payer: Self-pay | Admitting: Family Medicine

## 2019-09-02 NOTE — Telephone Encounter (Signed)
Paperwork received via fax today / FMLA paperwork placed in provider/cma box at [facility] station .  FR

## 2019-09-03 NOTE — Telephone Encounter (Signed)
Pt needs to be seen by provider. Has hosp f/u appt with Dr. Mitchel Honour on 09/08/2018. Forms to be reviewed at appt time

## 2019-09-09 ENCOUNTER — Other Ambulatory Visit: Payer: Self-pay

## 2019-09-09 ENCOUNTER — Ambulatory Visit: Payer: Managed Care, Other (non HMO) | Admitting: Emergency Medicine

## 2019-09-09 ENCOUNTER — Encounter: Payer: Self-pay | Admitting: Emergency Medicine

## 2019-09-09 VITALS — BP 104/69 | HR 87 | Temp 98.4°F | Resp 16 | Ht 61.0 in | Wt 128.0 lb

## 2019-09-09 DIAGNOSIS — Z09 Encounter for follow-up examination after completed treatment for conditions other than malignant neoplasm: Secondary | ICD-10-CM

## 2019-09-09 DIAGNOSIS — J22 Unspecified acute lower respiratory infection: Secondary | ICD-10-CM

## 2019-09-09 NOTE — Patient Instructions (Addendum)
   If you have lab work done today you will be contacted with your lab results within the next 2 weeks.  If you have not heard from us then please contact us. The fastest way to get your results is to register for My Chart.   IF you received an x-ray today, you will receive an invoice from [facility]. Please contact [facility] at [phone] with questions or concerns regarding your invoice.   IF you received labwork today, you will receive an invoice from [facility]. Please contact [facility] at [phone] with questions or concerns regarding your invoice.   Our billing staff will not be able to assist you with questions regarding bills from these companies.  You will be contacted with the lab results as soon as they are available. The fastest way to get your results is to activate your My Chart account. Instructions are located on the last page of this paperwork. If you have not heard from us regarding the results in 2 weeks, please contact this office.       Health Maintenance, Female Adopting a healthy lifestyle and getting preventive care are important in promoting health and wellness. Ask your health care provider about:  The right schedule for you to have regular tests and exams.  Things you can do on your own to prevent diseases and keep yourself healthy. What should I know about diet, weight, and exercise? Eat a healthy diet   Eat a diet that includes plenty of vegetables, fruits, low-fat dairy products, and lean protein.  Do not eat a lot of foods that are high in solid fats, added sugars, or sodium. Maintain a healthy weight Body mass index (BMI) is used to identify weight problems. It estimates body fat based on height and weight. Your health care provider can help determine your BMI and help you achieve or maintain a healthy weight. Get regular exercise Get regular exercise. This is one of the most important things you can do for your health. Most  adults should:  Exercise for at least 150 minutes each week. The exercise should increase your heart rate and make you sweat (moderate-intensity exercise).  Do strengthening exercises at least twice a week. This is in addition to the moderate-intensity exercise.  Spend less time sitting. Even light physical activity can be beneficial. Watch cholesterol and blood lipids Have your blood tested for lipids and cholesterol at 38 years of age, then have this test every 5 years. Have your cholesterol levels checked more often if:  Your lipid or cholesterol levels are high.  You are older than 38 years of age.  You are at high risk for heart disease. What should I know about cancer screening? Depending on your health history and family history, you may need to have cancer screening at various ages. This may include screening for:  Breast cancer.  Cervical cancer.  Colorectal cancer.  Skin cancer.  Lung cancer. What should I know about heart disease, diabetes, and high blood pressure? Blood pressure and heart disease  High blood pressure causes heart disease and increases the risk of stroke. This is more likely to develop in people who have high blood pressure readings, are of African descent, or are overweight.  Have your blood pressure checked: ? Every 3-5 years if you are 18-39 years of age. ? Every year if you are 40 years old or older. Diabetes Have regular diabetes screenings. This checks your fasting blood sugar level. Have the screening done:  Once   every three years after age 40 if you are at a normal weight and have a low risk for diabetes.  More often and at a younger age if you are overweight or have a high risk for diabetes. What should I know about preventing infection? Hepatitis B If you have a higher risk for hepatitis B, you should be screened for this virus. Talk with your health care provider to find out if you are at risk for hepatitis B infection. Hepatitis  C Testing is recommended for:  Everyone born from 1945 through 1965.  Anyone with known risk factors for hepatitis C. Sexually transmitted infections (STIs)  Get screened for STIs, including gonorrhea and chlamydia, if: ? You are sexually active and are younger than 38 years of age. ? You are older than 38 years of age and your health care provider tells you that you are at risk for this type of infection. ? Your sexual activity has changed since you were last screened, and you are at increased risk for chlamydia or gonorrhea. Ask your health care provider if you are at risk.  Ask your health care provider about whether you are at high risk for HIV. Your health care provider may recommend a prescription medicine to help prevent HIV infection. If you choose to take medicine to prevent HIV, you should first get tested for HIV. You should then be tested every 3 months for as long as you are taking the medicine. Pregnancy  If you are about to stop having your period (premenopausal) and you may become pregnant, seek counseling before you get pregnant.  Take 400 to 800 micrograms (mcg) of folic acid every day if you become pregnant.  Ask for birth control (contraception) if you want to prevent pregnancy. Osteoporosis and menopause Osteoporosis is a disease in which the bones lose minerals and strength with aging. This can result in bone fractures. If you are 65 years old or older, or if you are at risk for osteoporosis and fractures, ask your health care provider if you should:  Be screened for bone loss.  Take a calcium or vitamin D supplement to lower your risk of fractures.  Be given hormone replacement therapy (HRT) to treat symptoms of menopause. Follow these instructions at home: Lifestyle  Do not use any products that contain nicotine or tobacco, such as cigarettes, e-cigarettes, and chewing tobacco. If you need help quitting, ask your health care provider.  Do not use street  drugs.  Do not share needles.  Ask your health care provider for help if you need support or information about quitting drugs. Alcohol use  Do not drink alcohol if: ? Your health care provider tells you not to drink. ? You are pregnant, may be pregnant, or are planning to become pregnant.  If you drink alcohol: ? Limit how much you use to 0-1 drink a day. ? Limit intake if you are breastfeeding.  Be aware of how much alcohol is in your drink. In the [place] one drink equals one 12 oz bottle of beer (355 mL), one 5 oz glass of wine (148 mL), or one 1 oz glass of hard liquor (44 mL). General instructions  Schedule regular health, dental, and eye exams.  Stay current with your vaccines.  Tell your health care provider if: ? You often feel depressed. ? You have ever been abused or do not feel safe at home. Summary  Adopting a healthy lifestyle and getting preventive care are important in promoting health and   wellness.  Follow your health care provider's instructions about healthy diet, exercising, and getting tested or screened for diseases.  Follow your health care provider's instructions on monitoring your cholesterol and blood pressure. This information is not intended to replace advice given to you by your health care provider. Make sure you discuss any questions you have with your health care provider. Document Revised: 08/15/2018 Document Reviewed: 08/15/2018 [employer]vier Patient Education  2020 [employer]  

## 2019-09-09 NOTE — Progress Notes (Signed)
Penny JunesBeenta Bishop 39 y.o.   Chief Complaint  Patient presents with  . Bronchitis    Hospital follow up - IP x 5 days    HISTORY OF PRESENT ILLNESS: This is a 39 y.o. female here for hospital follow-up.  Was admitted on 08/26/2019 and discharge on 08/30/2019 with diagnosis of pansinusitis, acute bronchitis, dehydration, atypical chest pain.  Treated with IV fluids and IV antibiotics.  Cardiac work-up was within normal limits.  Covid test was negative.  Just finished 10-day course of antibiotics.  Still feels weak with intermittent difficulty breathing.  States that she is about 70% better. FMLA paperwork submitted and completed. Expected return to work date: 09/23/2019. 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  HPI   Prior to Admission medications   Medication Sig Start Date End Date Taking? Authorizing Provider  albuterol (VENTOLIN HFA) 108 (90 Base) MCG/ACT inhaler Inhale 2 puffs into the lungs every 6 (six) hours as needed for wheezing or shortness of breath. 08/30/19  Yes Orpah CobbKadakia, Ajay, MD  atorvastatin (LIPITOR) 40 MG tablet Take 1 tablet (40 mg total) by mouth daily at 6 PM. 08/30/19  Yes Orpah CobbKadakia, Ajay, MD  B Complex Vitamins (VITAMIN B COMPLEX) TABS Take 1 tablet by mouth daily.    Yes [provider]  benzonatate (TESSALON) 200 MG capsule Take 1 capsule (200 mg total) by mouth 2 (two) times daily as needed for cough. 08/20/19  Yes SagardiaEilleen Kempf, Elorah Dewing Jose, MD  Biotin 5000 MCG CAPS Take 5,000 mcg by mouth daily.    Yes [provider]  fluticasone (FLONASE) 50 MCG/ACT nasal spray Place 1 spray into both nostrils daily. As needed. 08/31/19  Yes Orpah CobbKadakia, Ajay, MD  iron polysaccharides (NIFEREX) 150 MG capsule Take 150 mg by mouth daily.   Yes [provider]  levothyroxine (SYNTHROID) 25 MCG tablet Take 25 mcg by mouth daily. 08/21/19  Yes [provider]  loratadine (CLARITIN) 10 MG tablet Take 1 tablet (10 mg total) by mouth daily. As needed. 08/31/19   Yes Orpah CobbKadakia, Ajay, MD  meclizine (ANTIVERT) 25 MG tablet Take 1 tablet (25 mg total) by mouth 3 (three) times daily as needed for dizziness. 08/30/19  Yes Orpah CobbKadakia, Ajay, MD  Multiple Vitamin (MULTIVITAMIN WITH MINERALS) TABS tablet Take 1 tablet by mouth daily.   Yes [provider]  rizatriptan (MAXALT) 5 MG tablet Take 1 tablet (5 mg total) by mouth as needed for migraine. May repeat in 2 hours if needed 05/09/19  Yes Peyton NajjarHopper, David H, MD  Vitamin D-Vitamin K (VITAMIN K2-VITAMIN D3 PO) Take 1 tablet by mouth daily.   Yes [provider]  cefdinir (OMNICEF) 300 MG capsule Take 1 capsule (300 mg total) by mouth every 12 (twelve) hours. Patient not taking: Reported on 09/09/2019 08/30/19   Orpah CobbKadakia, Ajay, MD    Allergies  Allergen Reactions  . Chlorhexidine Gluconate Rash    There are no problems to display for this patient.   Past Medical History:  Diagnosis Date  . Hepatitis B ~ 2001  . Iron deficiency anemia   . Maternal fever during labor 03/20/2011  . Pneumonia 08/20/2014  . Pneumonia involving right lung 08/20/2014  . Status post repeat low transverse cesarean section 03/23/2011    Past Surgical History:  Procedure Laterality Date  . CESAREAN SECTION  04/05/2009;   . CESAREAN SECTION  03/20/2011   Procedure: CESAREAN SECTION;  Surgeon: Kathreen CosierBernard A Marshall, MD;  Location: [facility];  Service: Gynecology;  Laterality: N/A;  cord ph 7.18,  fundal massage by [employer] fund[employer]  . COSMETIC SURGERY      Social History   Socioeconomic History  . Marital status: Married    Spouse name: Not on file  . Number of children: Not on file  . Years of education: Not on file  . Highest education level: Not on file  Occupational History  . Occupation: Academic librarian  Tobacco Use  . Smoking status: Never Smoker  . Smokeless tobacco: Never Used  Substance and Sexual Activity  . Alcohol use: No  . Drug use: No  . Sexual activity: Yes  Other Topics Concern  . Not on file    Social History Narrative  . Not on file   Social Determinants of Health   Financial Resource Strain:   . Difficulty of Paying Living Expenses: Not on file  Food Insecurity:   . Worried About Programme researcher, broadcasting/film/video in the Last Year: Not on file  . Ran Out of Food in the Last Year: Not on file  Transportation Needs:   . Lack of Transportation (Medical): Not on file  . Lack of Transportation (Non-Medical): Not on file  Physical Activity:   . Days of Exercise per Week: Not on file  . Minutes of Exercise per Session: Not on file  Stress:   . Feeling of Stress : Not on file  Social Connections:   . Frequency of Communication with Friends and Family: Not on file  . Frequency of Social Gatherings with Friends and Family: Not on file  . Attends Religious Services: Not on file  . Active Member of Clubs or Organizations: Not on file  . Attends Banker Meetings: Not on file  . Marital Status: Not on file  Intimate Partner Violence:   . Fear of Current or Ex-Partner: Not on file  . Emotionally Abused: Not on file  . Physically Abused: Not on file  . Sexually Abused: Not on file    Family History  Problem Relation Age of Onset  . Diabetes Mother   . Hypertension Mother   . Hypertension Father   . Diabetes Maternal Grandmother   . Hypertension Maternal Grandmother   . Diabetes Maternal Grandfather   . Hypertension Maternal Grandfather   . Hypertension Paternal Grandmother   . Diabetes Paternal Grandmother   . COPD Paternal Grandfather   . Cancer Paternal Grandfather        throat     Review of Systems  Constitutional: Negative.  Negative for chills and fever.  HENT: Negative.  Negative for congestion and sore throat.   Respiratory: Negative.  Negative for cough and shortness of breath.   Cardiovascular: Negative.  Negative for chest pain and palpitations.  Gastrointestinal: Negative for abdominal pain, diarrhea, nausea and vomiting.  Genitourinary: Negative.    Musculoskeletal: Negative.   Skin: Negative.  Negative for rash.  Neurological: Positive for weakness. Negative for dizziness and headaches.  All other systems reviewed and are negative.  Today's Vitals   09/09/19 1137  BP: 104/69  Pulse: 87  Resp: 16  Temp: 98.4 F (36.9 C)  TempSrc: Temporal  SpO2: 97%  Weight: 128 lb (58.1 kg)  Height: 5\' 1"  (1.549 m)   Body mass index is 24.19 kg/m.   Physical Exam Vitals reviewed.  Constitutional:      Appearance: Normal appearance.  HENT:     Head: Normocephalic.  Eyes:     Extraocular Movements: Extraocular movements intact.     Conjunctiva/sclera: Conjunctivae normal.  Pupils: Pupils are equal, round, and reactive to light.  Cardiovascular:     Rate and Rhythm: Normal rate and regular rhythm.     Heart sounds: Normal heart sounds.  Pulmonary:     Effort: Pulmonary effort is normal.     Breath sounds: Normal breath sounds.  Musculoskeletal:        General: Normal range of motion.     Cervical back: Normal range of motion and neck supple.  Skin:    General: Skin is warm and dry.     Capillary Refill: Capillary refill takes less than 2 seconds.  Neurological:     General: No focal deficit present.     Mental Status: She is alert and oriented to person, place, and time.  Psychiatric:        Mood and Affect: Mood normal.        Behavior: Behavior normal.    A total of 25 minutes was spent in the room with the patient, greater than 50% of which was in counseling/coordination of care regarding review of most recent medical records including recent hospital stay, most recent blood results, diagnostic imaging tests, medications, diet and nutrition, prognosis, FMLA paperwork completion, and need for follow-up visit in 10 days.   ASSESSMENT & PLAN: Evelyna was seen today for bronchitis.  Diagnoses and all orders for this visit:  Lower respiratory infection Comments: Recovering  Hospital discharge  follow-up    Patient Instructions       If you have lab work done today you will be contacted with your lab results within the next 2 weeks.  If you have not heard from [place] then please contact us. The fastest way to get your results is to register for My Chart.   IF you received an x-ray today, you will receive an invoice from [facility]. Please contact [facility] at [phone] with questions or concerns regarding your invoice.   IF you received labwork today, you will receive an invoice from [facility]. Please contact [facility] at [phone] with questions or concerns regarding your invoice.   Our billing staff will not be able to assist you with questions regarding bills from these companies.  You will be contacted with the lab results as soon as they are available. The fastest way to get your results is to activate your My Chart account. Instructions are located on the last page of this paperwork. If you have not heard from [place] regarding the results in 2 weeks, please contact this office.     Health Maintenance, Female Adopting a healthy lifestyle and getting preventive care are important in promoting health and wellness. Ask your health care provider about:  The right schedule for you to have regular tests and exams.  Things you can do on your own to prevent diseases and keep yourself healthy. What should I know about diet, weight, and exercise? Eat a healthy diet   Eat a diet that includes plenty of vegetables, fruits, low-fat dairy products, and lean protein.  Do not eat a lot of foods that are high in solid fats, added sugars, or sodium. Maintain a healthy weight Body mass index (BMI) is used to identify weight problems. It estimates body fat based on height and weight. Your health care provider can help determine your BMI and help you achieve or maintain a healthy weight. Get regular exercise Get regular exercise. This is one of the most important  things you can do for your health. Most adults should:  Exercise  for at least 150 minutes each week. The exercise should increase your heart rate and make you sweat (moderate-intensity exercise).  Do strengthening exercises at least twice a week. This is in addition to the moderate-intensity exercise.  Spend less time sitting. Even light physical activity can be beneficial. Watch cholesterol and blood lipids Have your blood tested for lipids and cholesterol at 38 years of age, then have this test every 5 years. Have your cholesterol levels checked more often if:  Your lipid or cholesterol levels are high.  You are older than 38 years of age.  You are at high risk for heart disease. What should I know about cancer screening? Depending on your health history and family history, you may need to have cancer screening at various ages. This may include screening for:  Breast cancer.  Cervical cancer.  Colorectal cancer.  Skin cancer.  Lung cancer. What should I know about heart disease, diabetes, and high blood pressure? Blood pressure and heart disease  High blood pressure causes heart disease and increases the risk of stroke. This is more likely to develop in people who have high blood pressure readings, are of African descent, or are overweight.  Have your blood pressure checked: ? Every 3-5 years if you are 11-67 years of age. ? Every year if you are 33 years old or older. Diabetes Have regular diabetes screenings. This checks your fasting blood sugar level. Have the screening done:  Once every three years after age 58 if you are at a normal weight and have a low risk for diabetes.  More often and at a younger age if you are overweight or have a high risk for diabetes. What should I know about preventing infection? Hepatitis B If you have a higher risk for hepatitis B, you should be screened for this virus. Talk with your health care provider to find out if you are at risk  for hepatitis B infection. Hepatitis C Testing is recommended for:  Everyone born from [birth year] through 1964.  Anyone with known risk factors for hepatitis C. Sexually transmitted infections (STIs)  Get screened for STIs, including gonorrhea and chlamydia, if: ? You are sexually active and are younger than 38 years of age. ? You are older than 38 years of age and your health care provider tells you that you are at risk for this type of infection. ? Your sexual activity has changed since you were last screened, and you are at increased risk for chlamydia or gonorrhea. Ask your health care provider if you are at risk.  Ask your health care provider about whether you are at high risk for HIV. Your health care provider may recommend a prescription medicine to help prevent HIV infection. If you choose to take medicine to prevent HIV, you should first get tested for HIV. You should then be tested every 3 months for as long as you are taking the medicine. Pregnancy  If you are about to stop having your period (premenopausal) and you may become pregnant, seek counseling before you get pregnant.  Take 400 to 800 micrograms (mcg) of folic acid every day if you become pregnant.  Ask for birth control (contraception) if you want to prevent pregnancy. Osteoporosis and menopause Osteoporosis is a disease in which the bones lose minerals and strength with aging. This can result in bone fractures. If you are 72 years old or older, or if you are at risk for osteoporosis and fractures, ask your health care provider if you  should:  Be screened for bone loss.  Take a calcium or vitamin D supplement to lower your risk of fractures.  Be given hormone replacement therapy (HRT) to treat symptoms of menopause. Follow these instructions at home: Lifestyle  Do not use any products that contain nicotine or tobacco, such as cigarettes, e-cigarettes, and chewing tobacco. If you need help quitting, ask your health  care provider.  Do not use street drugs.  Do not share needles.  Ask your health care provider for help if you need support or information about quitting drugs. Alcohol use  Do not drink alcohol if: ? Your health care provider tells you not to drink. ? You are pregnant, may be pregnant, or are planning to become pregnant.  If you drink alcohol: ? Limit how much you use to 0-1 drink a day. ? Limit intake if you are breastfeeding.  Be aware of how much alcohol is in your drink. In the [place] one drink equals one 12 oz bottle of beer (355 mL), one 5 oz glass of wine (148 mL), or one 1 oz glass of hard liquor (44 mL). General instructions  Schedule regular health, dental, and eye exams.  Stay current with your vaccines.  Tell your health care provider if: ? You often feel depressed. ? You have ever been abused or do not feel safe at home. Summary  Adopting a healthy lifestyle and getting preventive care are important in promoting health and wellness.  Follow your health care provider's instructions about healthy diet, exercising, and getting tested or screened for diseases.  Follow your health care provider's instructions on monitoring your cholesterol and blood pressure. This information is not intended to replace advice given to you by your health care provider. Make sure you discuss any questions you have with your health care provider. Document Revised: 08/15/2018 Document Reviewed: 08/15/2018 Elsevier Patient Education  2020 [employer]      Edwina Barth, MD [facility]

## 2019-09-10 ENCOUNTER — Telehealth: Payer: Self-pay | Admitting: *Deleted

## 2019-09-10 NOTE — Telephone Encounter (Signed)
Faxed completed FMLA forms including OV notes, labs and hospital discharge summary to [employer] at [phone]. Confirmation page at 12:41 pm.

## 2019-09-19 ENCOUNTER — Other Ambulatory Visit: Payer: Self-pay

## 2019-09-19 ENCOUNTER — Encounter: Payer: Self-pay | Admitting: Emergency Medicine

## 2019-09-19 ENCOUNTER — Ambulatory Visit: Payer: Managed Care, Other (non HMO) | Admitting: Emergency Medicine

## 2019-09-19 VITALS — BP 105/72 | HR 83 | Temp 98.3°F | Resp 16 | Ht 61.0 in | Wt 129.0 lb

## 2019-09-19 DIAGNOSIS — B349 Viral infection, unspecified: Secondary | ICD-10-CM | POA: Diagnosis not present

## 2019-09-19 NOTE — Patient Instructions (Addendum)
   If you have lab work done today you will be contacted with your lab results within the next 2 weeks.  If you have not heard from us then please contact us. The fastest way to get your results is to register for My Chart.   IF you received an x-ray today, you will receive an invoice from [facility]. Please contact [facility] at [phone] with questions or concerns regarding your invoice.   IF you received labwork today, you will receive an invoice from [facility]. Please contact [facility] at [phone] with questions or concerns regarding your invoice.   Our billing staff will not be able to assist you with questions regarding bills from these companies.  You will be contacted with the lab results as soon as they are available. The fastest way to get your results is to activate your My Chart account. Instructions are located on the last page of this paperwork. If you have not heard from us regarding the results in 2 weeks, please contact this office.      Health Maintenance, Female Adopting a healthy lifestyle and getting preventive care are important in promoting health and wellness. Ask your health care provider about:  The right schedule for you to have regular tests and exams.  Things you can do on your own to prevent diseases and keep yourself healthy. What should I know about diet, weight, and exercise? Eat a healthy diet   Eat a diet that includes plenty of vegetables, fruits, low-fat dairy products, and lean protein.  Do not eat a lot of foods that are high in solid fats, added sugars, or sodium. Maintain a healthy weight Body mass index (BMI) is used to identify weight problems. It estimates body fat based on height and weight. Your health care provider can help determine your BMI and help you achieve or maintain a healthy weight. Get regular exercise Get regular exercise. This is one of the most important things you can do for your health. Most  adults should:  Exercise for at least 150 minutes each week. The exercise should increase your heart rate and make you sweat (moderate-intensity exercise).  Do strengthening exercises at least twice a week. This is in addition to the moderate-intensity exercise.  Spend less time sitting. Even light physical activity can be beneficial. Watch cholesterol and blood lipids Have your blood tested for lipids and cholesterol at 38 years of age, then have this test every 5 years. Have your cholesterol levels checked more often if:  Your lipid or cholesterol levels are high.  You are older than 38 years of age.  You are at high risk for heart disease. What should I know about cancer screening? Depending on your health history and family history, you may need to have cancer screening at various ages. This may include screening for:  Breast cancer.  Cervical cancer.  Colorectal cancer.  Skin cancer.  Lung cancer. What should I know about heart disease, diabetes, and high blood pressure? Blood pressure and heart disease  High blood pressure causes heart disease and increases the risk of stroke. This is more likely to develop in people who have high blood pressure readings, are of African descent, or are overweight.  Have your blood pressure checked: ? Every 3-5 years if you are 18-39 years of age. ? Every year if you are 40 years old or older. Diabetes Have regular diabetes screenings. This checks your fasting blood sugar level. Have the screening done:  Once every   three years after age 40 if you are at a normal weight and have a low risk for diabetes.  More often and at a younger age if you are overweight or have a high risk for diabetes. What should I know about preventing infection? Hepatitis B If you have a higher risk for hepatitis B, you should be screened for this virus. Talk with your health care provider to find out if you are at risk for hepatitis B infection. Hepatitis  C Testing is recommended for:  Everyone born from 1945 through 1965.  Anyone with known risk factors for hepatitis C. Sexually transmitted infections (STIs)  Get screened for STIs, including gonorrhea and chlamydia, if: ? You are sexually active and are younger than 38 years of age. ? You are older than 38 years of age and your health care provider tells you that you are at risk for this type of infection. ? Your sexual activity has changed since you were last screened, and you are at increased risk for chlamydia or gonorrhea. Ask your health care provider if you are at risk.  Ask your health care provider about whether you are at high risk for HIV. Your health care provider may recommend a prescription medicine to help prevent HIV infection. If you choose to take medicine to prevent HIV, you should first get tested for HIV. You should then be tested every 3 months for as long as you are taking the medicine. Pregnancy  If you are about to stop having your period (premenopausal) and you may become pregnant, seek counseling before you get pregnant.  Take 400 to 800 micrograms (mcg) of folic acid every day if you become pregnant.  Ask for birth control (contraception) if you want to prevent pregnancy. Osteoporosis and menopause Osteoporosis is a disease in which the bones lose minerals and strength with aging. This can result in bone fractures. If you are 65 years old or older, or if you are at risk for osteoporosis and fractures, ask your health care provider if you should:  Be screened for bone loss.  Take a calcium or vitamin D supplement to lower your risk of fractures.  Be given hormone replacement therapy (HRT) to treat symptoms of menopause. Follow these instructions at home: Lifestyle  Do not use any products that contain nicotine or tobacco, such as cigarettes, e-cigarettes, and chewing tobacco. If you need help quitting, ask your health care provider.  Do not use street  drugs.  Do not share needles.  Ask your health care provider for help if you need support or information about quitting drugs. Alcohol use  Do not drink alcohol if: ? Your health care provider tells you not to drink. ? You are pregnant, may be pregnant, or are planning to become pregnant.  If you drink alcohol: ? Limit how much you use to 0-1 drink a day. ? Limit intake if you are breastfeeding.  Be aware of how much alcohol is in your drink. In the [place] one drink equals one 12 oz bottle of beer (355 mL), one 5 oz glass of wine (148 mL), or one 1 oz glass of hard liquor (44 mL). General instructions  Schedule regular health, dental, and eye exams.  Stay current with your vaccines.  Tell your health care provider if: ? You often feel depressed. ? You have ever been abused or do not feel safe at home. Summary  Adopting a healthy lifestyle and getting preventive care are important in promoting health and wellness.    Follow your health care provider's instructions about healthy diet, exercising, and getting tested or screened for diseases.  Follow your health care provider's instructions on monitoring your cholesterol and blood pressure. This information is not intended to replace advice given to you by your health care provider. Make sure you discuss any questions you have with your health care provider. Document Revised: 08/15/2018 Document Reviewed: 08/15/2018 Elsevier Patient Education  [place].  Viral Illness, Adult Viruses are tiny germs that can get into a person's body and cause illness. There are many different types of viruses, and they cause many types of illness. Viral illnesses can range from mild to severe. They can affect various parts of the body. Common illnesses that are caused by a virus include colds and the flu. Viral illnesses also include serious conditions such as HIV/AIDS (human immunodeficiency virus/acquired immunodeficiency syndrome). A few  viruses have been linked to certain cancers. What are the causes? Many types of viruses can cause illness. Viruses invade cells in your body, multiply, and cause the infected cells to malfunction or die. When the cell dies, it releases more of the virus. When this happens, you develop symptoms of the illness, and the virus continues to spread to other cells. If the virus takes over the function of the cell, it can cause the cell to divide and grow out of control, as is the case when a virus causes cancer. Different viruses get into the body in different ways. You can get a virus by:  Swallowing food or water that is contaminated with the virus.  Breathing in droplets that have been coughed or sneezed into the air by an infected person.  Touching a surface that has been contaminated with the virus and then touching your eyes, nose, or mouth.  Being bitten by an insect or animal that carries the virus.  Having sexual contact with a person who is infected with the virus.  Being exposed to blood or fluids that contain the virus, either through an open cut or during a transfusion. If a virus enters your body, your body's defense system (immune system) will try to fight the virus. You may be at higher risk for a viral illness if your immune system is weak. What are the signs or symptoms? Symptoms vary depending on the type of virus and the location of the cells that it invades. Common symptoms of the main types of viral illnesses include: Cold and flu viruses  Fever.  Headache.  Sore throat.  Muscle aches.  Nasal congestion.  Cough. Digestive system (gastrointestinal) viruses  Fever.  Abdominal pain.  Nausea.  Diarrhea. Liver viruses (hepatitis)  Loss of appetite.  Tiredness.  Yellowing of the skin (jaundice). Brain and spinal cord viruses  Fever.  Headache.  Stiff neck.  Nausea and vomiting.  Confusion or sleepiness. Skin  viruses  Warts.  Itching.  Rash. Sexually transmitted viruses  Discharge.  Swelling.  Redness.  Rash. How is this treated? Viruses can be difficult to treat because they live within cells. Antibiotic medicines do not treat viruses because these drugs do not get inside cells. Treatment for a viral illness may include:  Resting and drinking plenty of fluids.  Medicines to relieve symptoms. These can include over-the-counter medicine for pain and fever, medicines for cough or congestion, and medicines to relieve diarrhea.  Antiviral medicines. These drugs are available only for certain types of viruses. They may help reduce flu symptoms if taken early. There are also many antiviral medicines  for hepatitis and HIV/AIDS. Some viral illnesses can be prevented with vaccinations. A common example is the flu shot. Follow these instructions at home: Medicines   Take over-the-counter and prescription medicines only as told by your health care provider.  If you were prescribed an antiviral medicine, take it as told by your health care provider. Do not stop taking the medicine even if you start to feel better.  Be aware of when antibiotics are needed and when they are not needed. Antibiotics do not treat viruses. If your health care provider thinks that you may have a bacterial infection as well as a viral infection, you may get an antibiotic. ? Do not ask for an antibiotic prescription if you have been diagnosed with a viral illness. That will not make your illness go away faster. ? Frequently taking antibiotics when they are not needed can lead to antibiotic resistance. When this develops, the medicine no longer works against the bacteria that it normally fights. General instructions  Drink enough fluids to keep your urine clear or pale yellow.  Rest as much as possible.  Return to your normal activities as told by your health care provider. Ask your health care provider what  activities are safe for you.  Keep all follow-up visits as told by your health care provider. This is important. How is this prevented? Take these actions to reduce your risk of viral infection:  Eat a healthy diet and get enough rest.  Wash your hands often with soap and water. This is especially important when you are in public places. If soap and water are not available, use hand sanitizer.  Avoid close contact with friends and family who have a viral illness.  If you travel to areas where viral gastrointestinal infection is common, avoid drinking water or eating raw food.  Keep your immunizations up to date. Get a flu shot every year as told by your health care provider.  Do not share toothbrushes, nail clippers, razors, or needles with other people.  Always practice safe sex.  Contact a health care provider if:  You have symptoms of a viral illness that do not go away.  Your symptoms come back after going away.  Your symptoms get worse. Get help right away if:  You have trouble breathing.  You have a severe headache or a stiff neck.  You have severe vomiting or abdominal pain. This information is not intended to replace advice given to you by your health care provider. Make sure you discuss any questions you have with your health care provider. Document Revised: 08/04/2017 Document Reviewed: 01/01/2016 Elsevier Patient Education  2020 [employer].

## 2019-09-19 NOTE — Progress Notes (Signed)
Penny Bishop Junes 39 y.o.   Chief Complaint  Patient presents with  . Bronchitis    follow up after in the hospital  08/26/2019    HISTORY OF PRESENT ILLNESS: This is a 39 y.o. female here for 10-day follow-up from visit on 09/09/2019 status post viral illness with secondary sinus infection and dehydration requiring hospital admission.  Today feels about 90% better but still weakness mostly in both legs.  Covid test was negative. No other complaints or medical concerns today.  Still not working.  HPI   Prior to Admission medications   Medication Sig Start Date End Date Taking? Authorizing Provider  albuterol (VENTOLIN HFA) 108 (90 Base) MCG/ACT inhaler Inhale 2 puffs into the lungs every 6 (six) hours as needed for wheezing or shortness of breath. 08/30/19  Yes Orpah Cobb, MD  atorvastatin (LIPITOR) 40 MG tablet Take 1 tablet (40 mg total) by mouth daily at 6 PM. 08/30/19  Yes Orpah Cobb, MD  B Complex Vitamins (VITAMIN B COMPLEX) TABS Take 1 tablet by mouth daily.    Yes [provider]  Biotin 5000 MCG CAPS Take 5,000 mcg by mouth daily.    Yes [provider]  fluticasone (FLONASE) 50 MCG/ACT nasal spray Place 1 spray into both nostrils daily. As needed. 08/31/19  Yes Orpah Cobb, MD  iron polysaccharides (NIFEREX) 150 MG capsule Take 150 mg by mouth daily.   Yes [provider]  levothyroxine (SYNTHROID) 25 MCG tablet Take 25 mcg by mouth daily. 08/21/19  Yes [provider]  loratadine (CLARITIN) 10 MG tablet Take 1 tablet (10 mg total) by mouth daily. As needed. 08/31/19  Yes Orpah Cobb, MD  meclizine (ANTIVERT) 25 MG tablet Take 1 tablet (25 mg total) by mouth 3 (three) times daily as needed for dizziness. 08/30/19  Yes Orpah Cobb, MD  Multiple Vitamin (MULTIVITAMIN WITH MINERALS) TABS tablet Take 1 tablet by mouth daily.   Yes [provider]  rizatriptan (MAXALT) 5 MG tablet Take 1 tablet (5 mg total) by mouth as needed for  migraine. May repeat in 2 hours if needed 05/09/19  Yes [name], MD  Vitamin D-Vitamin K (VITAMIN K2-VITAMIN D3 PO) Take 1 tablet by mouth daily.   Yes [provider]  benzonatate (TESSALON) 200 MG capsule Take 1 capsule (200 mg total) by mouth 2 (two) times daily as needed for cough. Patient not taking: Reported on 09/19/2019 08/20/19   Georgina Quint, MD  cefdinir (OMNICEF) 300 MG capsule Take 1 capsule (300 mg total) by mouth every 12 (twelve) hours. Patient not taking: Reported on 09/19/2019 08/30/19   Orpah Cobb, MD    Allergies  Allergen Reactions  . Chlorhexidine Gluconate Rash    There are no problems to display for this patient.   Past Medical History:  Diagnosis Date  . Hepatitis B ~ 2001  . Iron deficiency anemia   . Maternal fever during labor 03/20/2011  . Pneumonia 08/20/2014  . Pneumonia involving right lung 08/20/2014  . Status post repeat low transverse cesarean section 03/23/2011    Past Surgical History:  Procedure Laterality Date  . CESAREAN SECTION  04/05/2009;   . CESAREAN SECTION  03/20/2011   Procedure: CESAREAN SECTION;  Surgeon: [name], MD;  Location: [facility];  Service: Gynecology;  Laterality: N/A;  cord ph 7.18, fundal massage by [employer]  . COSMETIC SURGERY      Social History   Socioeconomic History  . Marital status: Married  Spouse name: Not on file  . Number of children: Not on file  . Years of education: Not on file  . Highest education level: Not on file  Occupational History  . Occupation: Academic librarian  Tobacco Use  . Smoking status: Never Smoker  . Smokeless tobacco: Never Used  Substance and Sexual Activity  . Alcohol use: No  . Drug use: No  . Sexual activity: Yes  Other Topics Concern  . Not on file  Social History Narrative  . Not on file   Social Determinants of Health   Financial Resource Strain:   . Difficulty of Paying Living Expenses: Not on file  Food Insecurity:     . Worried About Programme researcher, broadcasting/film/video in the Last Year: Not on file  . Ran Out of Food in the Last Year: Not on file  Transportation Needs:   . Lack of Transportation (Medical): Not on file  . Lack of Transportation (Non-Medical): Not on file  Physical Activity:   . Days of Exercise per Week: Not on file  . Minutes of Exercise per Session: Not on file  Stress:   . Feeling of Stress : Not on file  Social Connections:   . Frequency of Communication with Friends and Family: Not on file  . Frequency of Social Gatherings with Friends and Family: Not on file  . Attends Religious Services: Not on file  . Active Member of Clubs or Organizations: Not on file  . Attends Banker Meetings: Not on file  . Marital Status: Not on file  Intimate Partner Violence:   . Fear of Current or Ex-Partner: Not on file  . Emotionally Abused: Not on file  . Physically Abused: Not on file  . Sexually Abused: Not on file    Family History  Problem Relation Age of Onset  . Diabetes Mother   . Hypertension Mother   . Hypertension Father   . Diabetes Maternal Grandmother   . Hypertension Maternal Grandmother   . Diabetes Maternal Grandfather   . Hypertension Maternal Grandfather   . Hypertension Paternal Grandmother   . Diabetes Paternal Grandmother   . COPD Paternal Grandfather   . Cancer Paternal Grandfather        throat     Review of Systems  Constitutional: Negative.  Negative for chills and fever.  HENT: Negative.  Negative for congestion and sore throat.   Eyes: Negative.   Respiratory: Negative.  Negative for cough and shortness of breath.   Cardiovascular: Negative.  Negative for chest pain and palpitations.  Gastrointestinal: Negative for abdominal pain, blood in stool, diarrhea, nausea and vomiting.  Genitourinary: Negative.  Negative for dysuria and hematuria.  Musculoskeletal: Negative.  Negative for back pain, myalgias and neck pain.  Skin: Negative.  Negative for rash.   Neurological: Positive for weakness (Both legs).  All other systems reviewed and are negative.  Today's Vitals   09/19/19 1032  BP: 105/72  Pulse: 83  Resp: 16  Temp: 98.3 F (36.8 C)  TempSrc: Temporal  SpO2: 100%  Weight: 129 lb (58.5 kg)  Height: 5\' 1"  (1.549 m)   Body mass index is 24.37 kg/m.   Physical Exam Vitals reviewed.  Constitutional:      Appearance: Normal appearance.  HENT:     Head: Normocephalic.     Right Ear: Tympanic membrane, ear canal and external ear normal.     Left Ear: Tympanic membrane and ear canal normal.  Eyes:  Extraocular Movements: Extraocular movements intact.     Conjunctiva/sclera: Conjunctivae normal.     Pupils: Pupils are equal, round, and reactive to light.  Cardiovascular:     Rate and Rhythm: Normal rate and regular rhythm.     Pulses: Normal pulses.     Heart sounds: Normal heart sounds.  Pulmonary:     Effort: Pulmonary effort is normal.     Breath sounds: Normal breath sounds.  Musculoskeletal:        General: Normal range of motion.     Cervical back: Normal range of motion and neck supple.  Skin:    General: Skin is warm and dry.     Capillary Refill: Capillary refill takes less than 2 seconds.  Neurological:     General: No focal deficit present.     Mental Status: She is alert and oriented to person, place, and time.     Sensory: No sensory deficit.     Motor: No weakness.     Coordination: Coordination normal.     Gait: Gait normal.     Deep Tendon Reflexes: Reflexes normal.  Psychiatric:        Mood and Affect: Mood normal.        Behavior: Behavior normal.      ASSESSMENT & PLAN: Cadence was seen today for bronchitis.  Diagnoses and all orders for this visit:  Viral illness   Clinically stable and improving.  No red flag signs or symptoms.  No new symptoms or medical concerns identified today.  We will follow-up as needed.   Patient Instructions       If you have lab work done today you  will be contacted with your lab results within the next 2 weeks.  If you have not heard from [place] then please contact us. The fastest way to get your results is to register for My Chart.   IF you received an x-ray today, you will receive an invoice from [facility]. Please contact [facility] at [phone] with questions or concerns regarding your invoice.   IF you received labwork today, you will receive an invoice from [facility]. Please contact [facility] at [phone] with questions or concerns regarding your invoice.   Our billing staff will not be able to assist you with questions regarding bills from these companies.  You will be contacted with the lab results as soon as they are available. The fastest way to get your results is to activate your My Chart account. Instructions are located on the last page of this paperwork. If you have not heard from [place] regarding the results in 2 weeks, please contact this office.     Health Maintenance, Female Adopting a healthy lifestyle and getting preventive care are important in promoting health and wellness. Ask your health care provider about:  The right schedule for you to have regular tests and exams.  Things you can do on your own to prevent diseases and keep yourself healthy. What should I know about diet, weight, and exercise? Eat a healthy diet   Eat a diet that includes plenty of vegetables, fruits, low-fat dairy products, and lean protein.  Do not eat a lot of foods that are high in solid fats, added sugars, or sodium. Maintain a healthy weight Body mass index (BMI) is used to identify weight problems. It estimates body fat based on height and weight. Your health care provider can help determine your BMI and help you achieve or maintain a healthy weight. Get regular  exercise Get regular exercise. This is one of the most important things you can do for your health. Most adults should:  Exercise for at least 150  minutes each week. The exercise should increase your heart rate and make you sweat (moderate-intensity exercise).  Do strengthening exercises at least twice a week. This is in addition to the moderate-intensity exercise.  Spend less time sitting. Even light physical activity can be beneficial. Watch cholesterol and blood lipids Have your blood tested for lipids and cholesterol at 38 years of age, then have this test every 5 years. Have your cholesterol levels checked more often if:  Your lipid or cholesterol levels are high.  You are older than 38 years of age.  You are at high risk for heart disease. What should I know about cancer screening? Depending on your health history and family history, you may need to have cancer screening at various ages. This may include screening for:  Breast cancer.  Cervical cancer.  Colorectal cancer.  Skin cancer.  Lung cancer. What should I know about heart disease, diabetes, and high blood pressure? Blood pressure and heart disease  High blood pressure causes heart disease and increases the risk of stroke. This is more likely to develop in people who have high blood pressure readings, are of African descent, or are overweight.  Have your blood pressure checked: ? Every 3-5 years if you are 62-79 years of age. ? Every year if you are 27 years old or older. Diabetes Have regular diabetes screenings. This checks your fasting blood sugar level. Have the screening done:  Once every three years after age 55 if you are at a normal weight and have a low risk for diabetes.  More often and at a younger age if you are overweight or have a high risk for diabetes. What should I know about preventing infection? Hepatitis B If you have a higher risk for hepatitis B, you should be screened for this virus. Talk with your health care provider to find out if you are at risk for hepatitis B infection. Hepatitis C Testing is recommended for:  Everyone born  from [birth year] through 1964.  Anyone with known risk factors for hepatitis C. Sexually transmitted infections (STIs)  Get screened for STIs, including gonorrhea and chlamydia, if: ? You are sexually active and are younger than 38 years of age. ? You are older than 38 years of age and your health care provider tells you that you are at risk for this type of infection. ? Your sexual activity has changed since you were last screened, and you are at increased risk for chlamydia or gonorrhea. Ask your health care provider if you are at risk.  Ask your health care provider about whether you are at high risk for HIV. Your health care provider may recommend a prescription medicine to help prevent HIV infection. If you choose to take medicine to prevent HIV, you should first get tested for HIV. You should then be tested every 3 months for as long as you are taking the medicine. Pregnancy  If you are about to stop having your period (premenopausal) and you may become pregnant, seek counseling before you get pregnant.  Take 400 to 800 micrograms (mcg) of folic acid every day if you become pregnant.  Ask for birth control (contraception) if you want to prevent pregnancy. Osteoporosis and menopause Osteoporosis is a disease in which the bones lose minerals and strength with aging. This can result in bone fractures. If  you are 15 years old or older, or if you are at risk for osteoporosis and fractures, ask your health care provider if you should:  Be screened for bone loss.  Take a calcium or vitamin D supplement to lower your risk of fractures.  Be given hormone replacement therapy (HRT) to treat symptoms of menopause. Follow these instructions at home: Lifestyle  Do not use any products that contain nicotine or tobacco, such as cigarettes, e-cigarettes, and chewing tobacco. If you need help quitting, ask your health care provider.  Do not use street drugs.  Do not share needles.  Ask your health  care provider for help if you need support or information about quitting drugs. Alcohol use  Do not drink alcohol if: ? Your health care provider tells you not to drink. ? You are pregnant, may be pregnant, or are planning to become pregnant.  If you drink alcohol: ? Limit how much you use to 0-1 drink a day. ? Limit intake if you are breastfeeding.  Be aware of how much alcohol is in your drink. In the [place] one drink equals one 12 oz bottle of beer (355 mL), one 5 oz glass of wine (148 mL), or one 1 oz glass of hard liquor (44 mL). General instructions  Schedule regular health, dental, and eye exams.  Stay current with your vaccines.  Tell your health care provider if: ? You often feel depressed. ? You have ever been abused or do not feel safe at home. Summary  Adopting a healthy lifestyle and getting preventive care are important in promoting health and wellness.  Follow your health care provider's instructions about healthy diet, exercising, and getting tested or screened for diseases.  Follow your health care provider's instructions on monitoring your cholesterol and blood pressure. This information is not intended to replace advice given to you by your health care provider. Make sure you discuss any questions you have with your health care provider. Document Revised: 08/15/2018 Document Reviewed: 08/15/2018 Elsevier Patient Education  2020 [employer].  Viral Illness, Adult Viruses are tiny germs that can get into a person's body and cause illness. There are many different types of viruses, and they cause many types of illness. Viral illnesses can range from mild to severe. They can affect various parts of the body. Common illnesses that are caused by a virus include colds and the flu. Viral illnesses also include serious conditions such as HIV/AIDS (human immunodeficiency virus/acquired immunodeficiency syndrome). A few viruses have been linked to certain cancers. What  are the causes? Many types of viruses can cause illness. Viruses invade cells in your body, multiply, and cause the infected cells to malfunction or die. When the cell dies, it releases more of the virus. When this happens, you develop symptoms of the illness, and the virus continues to spread to other cells. If the virus takes over the function of the cell, it can cause the cell to divide and grow out of control, as is the case when a virus causes cancer. Different viruses get into the body in different ways. You can get a virus by:  Swallowing food or water that is contaminated with the virus.  Breathing in droplets that have been coughed or sneezed into the air by an infected person.  Touching a surface that has been contaminated with the virus and then touching your eyes, nose, or mouth.  Being bitten by an insect or animal that carries the virus.  Having sexual contact with a  person who is infected with the virus.  Being exposed to blood or fluids that contain the virus, either through an open cut or during a transfusion. If a virus enters your body, your body's defense system (immune system) will try to fight the virus. You may be at higher risk for a viral illness if your immune system is weak. What are the signs or symptoms? Symptoms vary depending on the type of virus and the location of the cells that it invades. Common symptoms of the main types of viral illnesses include: Cold and flu viruses  Fever.  Headache.  Sore throat.  Muscle aches.  Nasal congestion.  Cough. Digestive system (gastrointestinal) viruses  Fever.  Abdominal pain.  Nausea.  Diarrhea. Liver viruses (hepatitis)  Loss of appetite.  Tiredness.  Yellowing of the skin (jaundice). Brain and spinal cord viruses  Fever.  Headache.  Stiff neck.  Nausea and vomiting.  Confusion or sleepiness. Skin viruses  Warts.  Itching.  Rash. Sexually transmitted  viruses  Discharge.  Swelling.  Redness.  Rash. How is this treated? Viruses can be difficult to treat because they live within cells. Antibiotic medicines do not treat viruses because these drugs do not get inside cells. Treatment for a viral illness may include:  Resting and drinking plenty of fluids.  Medicines to relieve symptoms. These can include over-the-counter medicine for pain and fever, medicines for cough or congestion, and medicines to relieve diarrhea.  Antiviral medicines. These drugs are available only for certain types of viruses. They may help reduce flu symptoms if taken early. There are also many antiviral medicines for hepatitis and HIV/AIDS. Some viral illnesses can be prevented with vaccinations. A common example is the flu shot. Follow these instructions at home: Medicines   Take over-the-counter and prescription medicines only as told by your health care provider.  If you were prescribed an antiviral medicine, take it as told by your health care provider. Do not stop taking the medicine even if you start to feel better.  Be aware of when antibiotics are needed and when they are not needed. Antibiotics do not treat viruses. If your health care provider thinks that you may have a bacterial infection as well as a viral infection, you may get an antibiotic. ? Do not ask for an antibiotic prescription if you have been diagnosed with a viral illness. That will not make your illness go away faster. ? Frequently taking antibiotics when they are not needed can lead to antibiotic resistance. When this develops, the medicine no longer works against the bacteria that it normally fights. General instructions  Drink enough fluids to keep your urine clear or pale yellow.  Rest as much as possible.  Return to your normal activities as told by your health care provider. Ask your health care provider what activities are safe for you.  Keep all follow-up visits as told by  your health care provider. This is important. How is this prevented? Take these actions to reduce your risk of viral infection:  Eat a healthy diet and get enough rest.  Wash your hands often with soap and water. This is especially important when you are in public places. If soap and water are not available, use hand sanitizer.  Avoid close contact with friends and family who have a viral illness.  If you travel to areas where viral gastrointestinal infection is common, avoid drinking water or eating raw food.  Keep your immunizations up to date. Get a flu shot every  year as told by your health care provider.  Do not share toothbrushes, nail clippers, razors, or needles with other people.  Always practice safe sex.  Contact a health care provider if:  You have symptoms of a viral illness that do not go away.  Your symptoms come back after going away.  Your symptoms get worse. Get help right away if:  You have trouble breathing.  You have a severe headache or a stiff neck.  You have severe vomiting or abdominal pain. This information is not intended to replace advice given to you by your health care provider. Make sure you discuss any questions you have with your health care provider. Document Revised: 08/04/2017 Document Reviewed: 01/01/2016 Elsevier Patient Education  2020 Elsevier Inc.      Edwina BarthMiguel Johni Narine, MD [facility]

## 2019-09-20 ENCOUNTER — Telehealth: Payer: Self-pay | Admitting: Emergency Medicine

## 2019-09-20 NOTE — Telephone Encounter (Signed)
Fax has come in for pt disability/ fmla   09/20/19

## 2019-09-20 NOTE — Telephone Encounter (Signed)
They have been put in the providers box (providers lounge)

## 2019-09-24 ENCOUNTER — Ambulatory Visit: Payer: Managed Care, Other (non HMO) | Admitting: Emergency Medicine

## 2019-09-24 ENCOUNTER — Other Ambulatory Visit: Payer: Self-pay

## 2019-09-24 ENCOUNTER — Encounter: Payer: Self-pay | Admitting: Emergency Medicine

## 2019-09-24 ENCOUNTER — Ambulatory Visit (INDEPENDENT_AMBULATORY_CARE_PROVIDER_SITE_OTHER): Payer: Managed Care, Other (non HMO)

## 2019-09-24 VITALS — BP 112/73 | HR 96 | Temp 98.1°F | Resp 16 | Ht 61.0 in | Wt 129.0 lb

## 2019-09-24 DIAGNOSIS — B349 Viral infection, unspecified: Secondary | ICD-10-CM

## 2019-09-24 DIAGNOSIS — R05 Cough: Secondary | ICD-10-CM

## 2019-09-24 DIAGNOSIS — R059 Cough, unspecified: Secondary | ICD-10-CM

## 2019-09-24 NOTE — Progress Notes (Signed)
Penny Bishop 39 y.o.   Chief Complaint  Patient presents with  . Cough    per patient continous and will go away and nothing comes out, tried cough medicine  09/09/2019 office visit note: This is a 39 y.o. female here for hospital follow-up.  Was admitted on 08/26/2019 and discharge on 08/30/2019 with diagnosis of pansinusitis, acute bronchitis, dehydration, atypical chest pain.  Treated with IV fluids and IV antibiotics.  Cardiac work-up was within normal limits.  Covid test was negative.  Just finished 10-day course of antibiotics.  Still feels weak with intermittent difficulty breathing.  States that she is about 70% better. FMLA paperwork submitted and completed. Expected return to work date: 09/23/2019. 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  HISTORY OF PRESENT ILLNESS: This is a 39 y.o. female complaining of new dry cough that started 4 days ago.  No associated symptoms.  Denies fever or chills.  Denies chest pain or difficulty breathing.  HPI   Prior to Admission medications   Medication Sig Start Date End Date Taking? Authorizing Provider  albuterol (VENTOLIN HFA) 108 (90 Base) MCG/ACT inhaler Inhale 2 puffs into the lungs every 6 (six) hours as needed for wheezing or shortness of breath. 08/30/19  Yes Orpah Cobb, MD  B Complex Vitamins (VITAMIN B COMPLEX) TABS Take 1 tablet by mouth daily.    Yes [provider]  Biotin 5000 MCG CAPS Take 5,000 mcg by mouth daily.    Yes [provider]  fluticasone (FLONASE) 50 MCG/ACT nasal spray Place 1 spray into both nostrils daily. As needed. 08/31/19  Yes Orpah Cobb, MD  iron polysaccharides (NIFEREX) 150 MG capsule Take 150 mg by mouth daily.   Yes [provider]  levothyroxine (SYNTHROID) 25 MCG tablet Take 25 mcg by mouth daily. 08/21/19  Yes [provider]  loratadine (CLARITIN) 10 MG tablet Take 1 tablet (10 mg total) by mouth daily. As needed. 08/31/19  Yes Orpah Cobb, MD  meclizine  (ANTIVERT) 25 MG tablet Take 1 tablet (25 mg total) by mouth 3 (three) times daily as needed for dizziness. 08/30/19  Yes Orpah Cobb, MD  Multiple Vitamin (MULTIVITAMIN WITH MINERALS) TABS tablet Take 1 tablet by mouth daily.   Yes [provider]  rizatriptan (MAXALT) 5 MG tablet Take 1 tablet (5 mg total) by mouth as needed for migraine. May repeat in 2 hours if needed 05/09/19  Yes [name], MD  Vitamin D-Vitamin K (VITAMIN K2-VITAMIN D3 PO) Take 1 tablet by mouth daily.   Yes [provider]  atorvastatin (LIPITOR) 40 MG tablet Take 1 tablet (40 mg total) by mouth daily at 6 PM. Patient not taking: Reported on 09/24/2019 08/30/19   Orpah Cobb, MD  benzonatate (TESSALON) 200 MG capsule Take 1 capsule (200 mg total) by mouth 2 (two) times daily as needed for cough. Patient not taking: Reported on 09/24/2019 08/20/19   Georgina Quint, MD  cefdinir (OMNICEF) 300 MG capsule Take 1 capsule (300 mg total) by mouth every 12 (twelve) hours. Patient not taking: Reported on 09/24/2019 08/30/19   Orpah Cobb, MD    Allergies  Allergen Reactions  . Chlorhexidine Gluconate Rash    There are no problems to display for this patient.   Past Medical History:  Diagnosis Date  . Hepatitis B ~ 2001  . Iron deficiency anemia   . Maternal fever during labor 03/20/2011  . Pneumonia 08/20/2014  . Pneumonia involving right lung 08/20/2014  . Status post repeat  low transverse cesarean section 03/23/2011    Past Surgical History:  Procedure Laterality Date  . CESAREAN SECTION  04/05/2009;   . CESAREAN SECTION  03/20/2011   Procedure: CESAREAN SECTION;  Surgeon: Frederico Hamman, MD;  Location: [facility];  Service: Gynecology;  Laterality: N/A;  cord ph 7.18, fundal massage by [employer] Therapist, sports  . COSMETIC SURGERY      Social History   Socioeconomic History  . Marital status: Married    Spouse name: Not on file  . Number of children: Not on file  . Years of education: Not  on file  . Highest education level: Not on file  Occupational History  . Occupation: Estate manager/land agent  Tobacco Use  . Smoking status: Never Smoker  . Smokeless tobacco: Never Used  Substance and Sexual Activity  . Alcohol use: No  . Drug use: No  . Sexual activity: Yes  Other Topics Concern  . Not on file  Social History Narrative  . Not on file   Social Determinants of Health   Financial Resource Strain:   . Difficulty of Paying Living Expenses: Not on file  Food Insecurity:   . Worried About Charity fundraiser in the Last Year: Not on file  . Ran Out of Food in the Last Year: Not on file  Transportation Needs:   . Lack of Transportation (Medical): Not on file  . Lack of Transportation (Non-Medical): Not on file  Physical Activity:   . Days of Exercise per Week: Not on file  . Minutes of Exercise per Session: Not on file  Stress:   . Feeling of Stress : Not on file  Social Connections:   . Frequency of Communication with Friends and Family: Not on file  . Frequency of Social Gatherings with Friends and Family: Not on file  . Attends Religious Services: Not on file  . Active Member of Clubs or Organizations: Not on file  . Attends Archivist Meetings: Not on file  . Marital Status: Not on file  Intimate Partner Violence:   . Fear of Current or Ex-Partner: Not on file  . Emotionally Abused: Not on file  . Physically Abused: Not on file  . Sexually Abused: Not on file    Family History  Problem Relation Age of Onset  . Diabetes Mother   . Hypertension Mother   . Hypertension Father   . Diabetes Maternal Grandmother   . Hypertension Maternal Grandmother   . Diabetes Maternal Grandfather   . Hypertension Maternal Grandfather   . Hypertension Paternal Grandmother   . Diabetes Paternal Grandmother   . COPD Paternal Grandfather   . Cancer Paternal Grandfather        throat     Review of Systems  Constitutional: Negative.  Negative for chills and  fever.  HENT: Negative for congestion and sore throat.   Eyes: Negative.   Respiratory: Positive for cough.   Cardiovascular: Negative for chest pain and palpitations.  Gastrointestinal: Negative.  Negative for abdominal pain, diarrhea, nausea and vomiting.  Genitourinary: Negative.  Negative for dysuria.  Musculoskeletal: Negative for myalgias.       Intermittent tingling, numbness, cold sensation to her toes  Skin: Negative.  Negative for rash.  Neurological: Negative.  Negative for dizziness and headaches.  Endo/Heme/Allergies: Negative.   All other systems reviewed and are negative.   Today's Vitals   09/24/19 1009  BP: 112/73  Pulse: 96  Resp: 16  Temp: 98.1 F (36.7 C)  TempSrc: Temporal  SpO2: 98%  Weight: 129 lb (58.5 kg)  Height: 5\' 1"  (1.549 m)   Body mass index is 24.37 kg/m.  Physical Exam Vitals reviewed.  Constitutional:      Appearance: Normal appearance.  HENT:     Head: Normocephalic.     Mouth/Throat:     Mouth: Mucous membranes are moist.     Pharynx: Oropharynx is clear.  Eyes:     Extraocular Movements: Extraocular movements intact.     Conjunctiva/sclera: Conjunctivae normal.     Pupils: Pupils are equal, round, and reactive to light.  Cardiovascular:     Rate and Rhythm: Normal rate and regular rhythm.     Pulses: Normal pulses.     Heart sounds: Normal heart sounds.  Pulmonary:     Effort: Pulmonary effort is normal.     Breath sounds: Normal breath sounds.  Musculoskeletal:     Cervical back: Normal range of motion and neck supple.     Comments: Lower extremities: Feet: Good distal circulation, excellent capillary refill, excellent pulses with good sensation.  Warm to touch.  No ecchymosis or erythema.  Lymphadenopathy:     Cervical: No cervical adenopathy.  Skin:    General: Skin is warm and dry.     Capillary Refill: Capillary refill takes less than 2 seconds.  Neurological:     General: No focal deficit present.     Mental  Status: She is alert and oriented to person, place, and time.  Psychiatric:        Mood and Affect: Mood normal.        Behavior: Behavior normal.    DG Chest 2 View  Result Date: 09/24/2019 CLINICAL DATA:  Cough. EXAM: CHEST - 2 VIEW COMPARISON:  06/23/2019 FINDINGS: The heart size and mediastinal contours are within normal limits. Both lungs are clear. The visualized skeletal structures are unremarkable. IMPRESSION: Normal exam. Electronically Signed   By: 06/25/2019 M.D.   On: 09/24/2019 10:42     ASSESSMENT & PLAN: Rufina was seen today for cough.  Diagnoses and all orders for this visit:  Cough -     DG Chest 2 View; Future  Viral illness    Patient Instructions  Viral Illness, Adult Viruses are tiny germs that can get into a person's body and cause illness. There are many different types of viruses, and they cause many types of illness. Viral illnesses can range from mild to severe. They can affect various parts of the body. Common illnesses that are caused by a virus include colds and the flu. Viral illnesses also include serious conditions such as HIV/AIDS (human immunodeficiency virus/acquired immunodeficiency syndrome). A few viruses have been linked to certain cancers. What are the causes? Many types of viruses can cause illness. Viruses invade cells in your body, multiply, and cause the infected cells to malfunction or die. When the cell dies, it releases more of the virus. When this happens, you develop symptoms of the illness, and the virus continues to spread to other cells. If the virus takes over the function of the cell, it can cause the cell to divide and grow out of control, as is the case when a virus causes cancer. Different viruses get into the body in different ways. You can get a virus by:  Swallowing food or water that is contaminated with the virus.  Breathing in droplets that have been coughed or sneezed into the air by an infected person.  Touching  a surface  that has been contaminated with the virus and then touching your eyes, nose, or mouth.  Being bitten by an insect or animal that carries the virus.  Having sexual contact with a person who is infected with the virus.  Being exposed to blood or fluids that contain the virus, either through an open cut or during a transfusion. If a virus enters your body, your body's defense system (immune system) will try to fight the virus. You may be at higher risk for a viral illness if your immune system is weak. What are the signs or symptoms? Symptoms vary depending on the type of virus and the location of the cells that it invades. Common symptoms of the main types of viral illnesses include: Cold and flu viruses  Fever.  Headache.  Sore throat.  Muscle aches.  Nasal congestion.  Cough. Digestive system (gastrointestinal) viruses  Fever.  Abdominal pain.  Nausea.  Diarrhea. Liver viruses (hepatitis)  Loss of appetite.  Tiredness.  Yellowing of the skin (jaundice). Brain and spinal cord viruses  Fever.  Headache.  Stiff neck.  Nausea and vomiting.  Confusion or sleepiness. Skin viruses  Warts.  Itching.  Rash. Sexually transmitted viruses  Discharge.  Swelling.  Redness.  Rash. How is this treated? Viruses can be difficult to treat because they live within cells. Antibiotic medicines do not treat viruses because these drugs do not get inside cells. Treatment for a viral illness may include:  Resting and drinking plenty of fluids.  Medicines to relieve symptoms. These can include over-the-counter medicine for pain and fever, medicines for cough or congestion, and medicines to relieve diarrhea.  Antiviral medicines. These drugs are available only for certain types of viruses. They may help reduce flu symptoms if taken early. There are also many antiviral medicines for hepatitis and HIV/AIDS. Some viral illnesses can be prevented with vaccinations.  A common example is the flu shot. Follow these instructions at home: Medicines   Take over-the-counter and prescription medicines only as told by your health care provider.  If you were prescribed an antiviral medicine, take it as told by your health care provider. Do not stop taking the medicine even if you start to feel better.  Be aware of when antibiotics are needed and when they are not needed. Antibiotics do not treat viruses. If your health care provider thinks that you may have a bacterial infection as well as a viral infection, you may get an antibiotic. ? Do not ask for an antibiotic prescription if you have been diagnosed with a viral illness. That will not make your illness go away faster. ? Frequently taking antibiotics when they are not needed can lead to antibiotic resistance. When this develops, the medicine no longer works against the bacteria that it normally fights. General instructions  Drink enough fluids to keep your urine clear or pale yellow.  Rest as much as possible.  Return to your normal activities as told by your health care provider. Ask your health care provider what activities are safe for you.  Keep all follow-up visits as told by your health care provider. This is important. How is this prevented? Take these actions to reduce your risk of viral infection:  Eat a healthy diet and get enough rest.  Wash your hands often with soap and water. This is especially important when you are in public places. If soap and water are not available, use hand sanitizer.  Avoid close contact with friends and family who have a viral  illness.  If you travel to areas where viral gastrointestinal infection is common, avoid drinking water or eating raw food.  Keep your immunizations up to date. Get a flu shot every year as told by your health care provider.  Do not share toothbrushes, nail clippers, razors, or needles with other people.  Always practice safe  sex.  Contact a health care provider if:  You have symptoms of a viral illness that do not go away.  Your symptoms come back after going away.  Your symptoms get worse. Get help right away if:  You have trouble breathing.  You have a severe headache or a stiff neck.  You have severe vomiting or abdominal pain. This information is not intended to replace advice given to you by your health care provider. Make sure you discuss any questions you have with your health care provider. Document Revised: 08/04/2017 Document Reviewed: 01/01/2016 Elsevier Patient Education  2020 Elsevier Inc.      Edwina BarthMiguel Casmira Cramer, MD [facility]

## 2019-09-24 NOTE — Patient Instructions (Signed)
Viral Illness, Adult Viruses are tiny germs that can get into a person's body and cause illness. There are many different types of viruses, and they cause many types of illness. Viral illnesses can range from mild to severe. They can affect various parts of the body. Common illnesses that are caused by a virus include colds and the flu. Viral illnesses also include serious conditions such as HIV/AIDS (human immunodeficiency virus/acquired immunodeficiency syndrome). A few viruses have been linked to certain cancers. What are the causes? Many types of viruses can cause illness. Viruses invade cells in your body, multiply, and cause the infected cells to malfunction or die. When the cell dies, it releases more of the virus. When this happens, you develop symptoms of the illness, and the virus continues to spread to other cells. If the virus takes over the function of the cell, it can cause the cell to divide and grow out of control, as is the case when a virus causes cancer. Different viruses get into the body in different ways. You can get a virus by:  Swallowing food or water that is contaminated with the virus.  Breathing in droplets that have been coughed or sneezed into the air by an infected person.  Touching a surface that has been contaminated with the virus and then touching your eyes, nose, or mouth.  Being bitten by an insect or animal that carries the virus.  Having sexual contact with a person who is infected with the virus.  Being exposed to blood or fluids that contain the virus, either through an open cut or during a transfusion. If a virus enters your body, your body's defense system (immune system) will try to fight the virus. You may be at higher risk for a viral illness if your immune system is weak. What are the signs or symptoms? Symptoms vary depending on the type of virus and the location of the cells that it invades. Common symptoms of the main types of viral illnesses  include: Cold and flu viruses  Fever.  Headache.  Sore throat.  Muscle aches.  Nasal congestion.  Cough. Digestive system (gastrointestinal) viruses  Fever.  Abdominal pain.  Nausea.  Diarrhea. Liver viruses (hepatitis)  Loss of appetite.  Tiredness.  Yellowing of the skin (jaundice). Brain and spinal cord viruses  Fever.  Headache.  Stiff neck.  Nausea and vomiting.  Confusion or sleepiness. Skin viruses  Warts.  Itching.  Rash. Sexually transmitted viruses  Discharge.  Swelling.  Redness.  Rash. How is this treated? Viruses can be difficult to treat because they live within cells. Antibiotic medicines do not treat viruses because these drugs do not get inside cells. Treatment for a viral illness may include:  Resting and drinking plenty of fluids.  Medicines to relieve symptoms. These can include over-the-counter medicine for pain and fever, medicines for cough or congestion, and medicines to relieve diarrhea.  Antiviral medicines. These drugs are available only for certain types of viruses. They may help reduce flu symptoms if taken early. There are also many antiviral medicines for hepatitis and HIV/AIDS. Some viral illnesses can be prevented with vaccinations. A common example is the flu shot. Follow these instructions at home: Medicines   Take over-the-counter and prescription medicines only as told by your health care provider.  If you were prescribed an antiviral medicine, take it as told by your health care provider. Do not stop taking the medicine even if you start to feel better.  Be aware of when   antibiotics are needed and when they are not needed. Antibiotics do not treat viruses. If your health care provider thinks that you may have a bacterial infection as well as a viral infection, you may get an antibiotic. ? Do not ask for an antibiotic prescription if you have been diagnosed with a viral illness. That will not make your  illness go away faster. ? Frequently taking antibiotics when they are not needed can lead to antibiotic resistance. When this develops, the medicine no longer works against the bacteria that it normally fights. General instructions  Drink enough fluids to keep your urine clear or pale yellow.  Rest as much as possible.  Return to your normal activities as told by your health care provider. Ask your health care provider what activities are safe for you.  Keep all follow-up visits as told by your health care provider. This is important. How is this prevented? Take these actions to reduce your risk of viral infection:  Eat a healthy diet and get enough rest.  Wash your hands often with soap and water. This is especially important when you are in public places. If soap and water are not available, use hand sanitizer.  Avoid close contact with friends and family who have a viral illness.  If you travel to areas where viral gastrointestinal infection is common, avoid drinking water or eating raw food.  Keep your immunizations up to date. Get a flu shot every year as told by your health care provider.  Do not share toothbrushes, nail clippers, razors, or needles with other people.  Always practice safe sex.  Contact a health care provider if:  You have symptoms of a viral illness that do not go away.  Your symptoms come back after going away.  Your symptoms get worse. Get help right away if:  You have trouble breathing.  You have a severe headache or a stiff neck.  You have severe vomiting or abdominal pain. This information is not intended to replace advice given to you by your health care provider. Make sure you discuss any questions you have with your health care provider. Document Revised: 08/04/2017 Document Reviewed: 01/01/2016 Elsevier Patient Education  2020 Elsevier Inc.  

## 2019-09-30 NOTE — Telephone Encounter (Signed)
Pt called in regard to FMLA. She would like to pick up because it is getting close to her deadline and she needs to have this submitted. Please advise

## 2019-10-01 ENCOUNTER — Telehealth: Payer: Self-pay | Admitting: Emergency Medicine

## 2019-10-01 NOTE — Telephone Encounter (Signed)
Pt needs ov notes and a medical explanation as to why she was out of work from 12/9-12/20/20. Please call pt when ready and she will pick up. [phone].

## 2019-10-02 NOTE — Telephone Encounter (Signed)
What is the status of this form?  Thanks.

## 2019-10-03 NOTE — Telephone Encounter (Signed)
Patient is calling back for this note.

## 2019-10-04 NOTE — Telephone Encounter (Signed)
I have attempted to call pt with no success.  Pt is requesting note for 08/14/2019-08/25/2019.   Pt was last seen on 09/19/2019 for viral illness. A letter was written on 09/19/2019 for pt to return on 09/30/2019.  Pt had covid like sx from 08/20/2019. Pt was also discharged from hospital on 08/30/2019 and was admitted on 08/26/2019.  Please advise if we can write a message in regards to the COVID like sx.

## 2019-10-05 NOTE — Telephone Encounter (Signed)
Yes, it's ok to write note. Thanks.

## 2019-10-07 ENCOUNTER — Telehealth: Payer: Self-pay | Admitting: Emergency Medicine

## 2019-10-07 ENCOUNTER — Encounter: Payer: Self-pay | Admitting: *Deleted

## 2019-10-07 NOTE — Telephone Encounter (Signed)
Pt calling in regards to work note , She will be stopping by at 12 . Says that she has been calling multiple times and needs it today or she will be fired .. (pt statement)   Telephone encounter 1/26 has more detail on the matter    Please advise

## 2019-10-07 NOTE — Telephone Encounter (Signed)
Note is being worked on by [employer] and Aram Beecham will be done by tomorrow

## 2019-10-08 ENCOUNTER — Telehealth: Payer: Self-pay | Admitting: Emergency Medicine

## 2019-10-08 NOTE — Telephone Encounter (Signed)
Patient has been informed that copy of FLMA and work note is at front desk ready for pick up

## 2019-10-08 NOTE — Telephone Encounter (Signed)
Patient informed and paperwork at front desk

## 2019-10-08 NOTE — Telephone Encounter (Signed)
FLMA paper work has been faxed and confirmation has been received. Patient was also given a copy of the FLMA paper work for her records and a  note with her requested dates of coverage.

## 2019-10-08 NOTE — Telephone Encounter (Signed)
Patients employer needs complete visit summary. Patient has AVS but doctors notes complete visit . FR

## 2019-10-09 NOTE — Telephone Encounter (Signed)
Patient was informed that she needed to fill out her part of the paperwork then given back to [place] so we can refax with her part of the paperwork filled out. Patient stated she will bring paperwork back tomorrow for [place] to copy and faxed to the company

## 2019-10-09 NOTE — Telephone Encounter (Signed)
Pt says her company is needing a complete note stating-what her symptoms were on the [facility] 09/24/19. They do not want her ov notes, they want a complete note. Please advise [phone] and if you have any questions. She would like [place] to send this info by my chart.

## 2019-10-10 NOTE — Telephone Encounter (Signed)
Pt returned forms to office. Copies were made after verifying pt filled out her part. Pt left with her original pprwrk. Forms were refaxed to [employer]

## 2019-10-11 NOTE — Telephone Encounter (Signed)
On 10/09/2019, patient came to the office to pick up copies of FMLA for her records and complete the employee sections of the forms. I attached yellow tabs to the sections to be complete by her and give back to check-in so the I could re-fax them, [facility] faxed them the first time. The patient kept all the forms and Felicia called her to tell her to bring the forms back to be re-fax after completing employee sections. Per Felicia patient will bring them back.

## 2019-10-28 ENCOUNTER — Telehealth: Payer: Self-pay | Admitting: Emergency Medicine

## 2019-10-28 NOTE — Telephone Encounter (Signed)
Copied from CRM [id number]. Topic: General - Inquiry >> Oct 28, 2019 12:15 PM Floria Raveling A wrote: Reason for CRM: pt called in and requesting a call back. She has some question about her FMLA Best number  [phone]

## 2019-10-29 NOTE — Telephone Encounter (Signed)
Patient is calling back work needs office note or letter for these  specific dates  for January 18 and the 19th . You can put office note on my chart or sha can pick up  Best number [phone] she needs before the end of the week

## 2019-10-30 NOTE — Telephone Encounter (Signed)
Called pt and rewrote letter and sent it to pt via My Chart. If pt comes to office to pick it up it can be printed and given to her.   Thanks

## 2019-11-04 ENCOUNTER — Other Ambulatory Visit: Payer: Self-pay

## 2019-11-04 ENCOUNTER — Other Ambulatory Visit (HOSPITAL_COMMUNITY)
Admission: RE | Admit: 2019-11-04 | Discharge: 2019-11-04 | Disposition: A | Discharge status: Home / Self Care | Payer: Managed Care, Other (non HMO) | Source: Ambulatory Visit | Attending: Emergency Medicine | Admitting: Emergency Medicine

## 2019-11-04 ENCOUNTER — Encounter: Payer: Self-pay | Admitting: Emergency Medicine

## 2019-11-04 ENCOUNTER — Ambulatory Visit (INDEPENDENT_AMBULATORY_CARE_PROVIDER_SITE_OTHER): Payer: Managed Care, Other (non HMO) | Admitting: Emergency Medicine

## 2019-11-04 VITALS — BP 107/69 | HR 79 | Temp 98.2°F | Resp 16 | Ht 61.0 in | Wt 128.0 lb

## 2019-11-04 DIAGNOSIS — Z13228 Encounter for screening for other metabolic disorders: Secondary | ICD-10-CM

## 2019-11-04 DIAGNOSIS — Z124 Encounter for screening for malignant neoplasm of cervix: Secondary | ICD-10-CM | POA: Diagnosis present

## 2019-11-04 DIAGNOSIS — Z0000 Encounter for general adult medical examination without abnormal findings: Secondary | ICD-10-CM | POA: Diagnosis not present

## 2019-11-04 DIAGNOSIS — Z13220 Encounter for screening for lipoid disorders: Secondary | ICD-10-CM | POA: Diagnosis not present

## 2019-11-04 DIAGNOSIS — Z1329 Encounter for screening for other suspected endocrine disorder: Secondary | ICD-10-CM

## 2019-11-04 DIAGNOSIS — Z130 Encounter for screening for diseases of the blood and blood-forming organs and certain disorders involving the immune mechanism: Secondary | ICD-10-CM

## 2019-11-04 NOTE — Progress Notes (Signed)
Penny Bishop 39 y.o.   Chief Complaint  Patient presents with  . Annual Exam    WITH PAP SMEAR    HISTORY OF PRESENT ILLNESS: This is a 39 y.o. female here for her annual exam. No complaints or medical concerns. Healthy female with a healthy lifestyle. Non-smoker. No chronic medical problems. Health maintenance related issues discussed with patient. Needs Pap smear.  HPI   Prior to Admission medications   Medication Sig Start Date End Date Taking? Authorizing Provider  albuterol (VENTOLIN HFA) 108 (90 Base) MCG/ACT inhaler Inhale 2 puffs into the lungs every 6 (six) hours as needed for wheezing or shortness of breath. 08/30/19  Yes Dixie Dials, MD  B Complex Vitamins (VITAMIN B COMPLEX) TABS Take 1 tablet by mouth daily.    Yes [provider]  Biotin 5000 MCG CAPS Take 5,000 mcg by mouth daily.    Yes [provider]  fluticasone (FLONASE) 50 MCG/ACT nasal spray Place 1 spray into both nostrils daily. As needed. 08/31/19  Yes Dixie Dials, MD  iron polysaccharides (NIFEREX) 150 MG capsule Take 150 mg by mouth daily.   Yes [provider]  levothyroxine (SYNTHROID) 25 MCG tablet Take 25 mcg by mouth daily. 08/21/19  Yes [provider]  Multiple Vitamin (MULTIVITAMIN WITH MINERALS) TABS tablet Take 1 tablet by mouth daily.   Yes [provider]  rizatriptan (MAXALT) 5 MG tablet Take 1 tablet (5 mg total) by mouth as needed for migraine. May repeat in 2 hours if needed 05/09/19  Yes Posey Boyer, MD  Vitamin D-Vitamin K (VITAMIN K2-VITAMIN D3 PO) Take 1 tablet by mouth daily.   Yes [provider]  atorvastatin (LIPITOR) 40 MG tablet Take 1 tablet (40 mg total) by mouth daily at 6 PM. Patient not taking: Reported on 11/04/2019 08/30/19   Dixie Dials, MD  loratadine (CLARITIN) 10 MG tablet Take 1 tablet (10 mg total) by mouth daily. As needed. Patient not taking: Reported on 11/04/2019 08/31/19   Dixie Dials, MD  meclizine  (ANTIVERT) 25 MG tablet Take 1 tablet (25 mg total) by mouth 3 (three) times daily as needed for dizziness. Patient not taking: Reported on 11/04/2019 08/30/19   Dixie Dials, MD    Allergies  Allergen Reactions  . Chlorhexidine Gluconate Rash    There are no problems to display for this patient.   Past Medical History:  Diagnosis Date  . Hepatitis B ~ 2001  . Iron deficiency anemia   . Maternal fever during labor 03/20/2011  . Pneumonia 08/20/2014  . Pneumonia involving right lung 08/20/2014  . Status post repeat low transverse cesarean section 03/23/2011    Past Surgical History:  Procedure Laterality Date  . CESAREAN SECTION  04/05/2009;   . CESAREAN SECTION  03/20/2011   Procedure: CESAREAN SECTION;  Surgeon: Frederico Hamman, MD;  Location: [facility];  Service: Gynecology;  Laterality: N/A;  cord ph 7.18, fundal massage by A. Green Therapist, sports  . COSMETIC SURGERY      Social History   Socioeconomic History  . Marital status: Married    Spouse name: Not on file  . Number of children: Not on file  . Years of education: Not on file  . Highest education level: Not on file  Occupational History  . Occupation: Estate manager/land agent  Tobacco Use  . Smoking status: Never Smoker  . Smokeless tobacco: Never Used  Substance and Sexual Activity  . Alcohol use: No  . Drug use: No  .  Sexual activity: Yes  Other Topics Concern  . Not on file  Social History Narrative  . Not on file   Social Determinants of Health   Financial Resource Strain:   . Difficulty of Paying Living Expenses: Not on file  Food Insecurity:   . Worried About Programme researcher, broadcasting/film/video in the Last Year: Not on file  . Ran Out of Food in the Last Year: Not on file  Transportation Needs:   . Lack of Transportation (Medical): Not on file  . Lack of Transportation (Non-Medical): Not on file  Physical Activity:   . Days of Exercise per Week: Not on file  . Minutes of Exercise per Session: Not on file  Stress:   .  Feeling of Stress : Not on file  Social Connections:   . Frequency of Communication with Friends and Family: Not on file  . Frequency of Social Gatherings with Friends and Family: Not on file  . Attends Religious Services: Not on file  . Active Member of Clubs or Organizations: Not on file  . Attends Banker Meetings: Not on file  . Marital Status: Not on file  Intimate Partner Violence:   . Fear of Current or Ex-Partner: Not on file  . Emotionally Abused: Not on file  . Physically Abused: Not on file  . Sexually Abused: Not on file    Family History  Problem Relation Age of Onset  . Diabetes Mother   . Hypertension Mother   . Hypertension Father   . Diabetes Maternal Grandmother   . Hypertension Maternal Grandmother   . Diabetes Maternal Grandfather   . Hypertension Maternal Grandfather   . Hypertension Paternal Grandmother   . Diabetes Paternal Grandmother   . COPD Paternal Grandfather   . Cancer Paternal Grandfather        throat     Review of Systems  Constitutional: Negative.  Negative for chills, fever and weight loss.       Hair loss  HENT: Negative.   Respiratory: Negative.  Negative for cough and shortness of breath.   Cardiovascular: Negative.  Negative for chest pain and palpitations.  Gastrointestinal: Negative.  Negative for abdominal pain, diarrhea, nausea and vomiting.  Genitourinary: Negative.  Negative for dysuria and hematuria.  Musculoskeletal: Negative.  Negative for myalgias and neck pain.  Skin: Negative.   Neurological: Negative.  Negative for dizziness and headaches.  Endo/Heme/Allergies: Negative.   All other systems reviewed and are negative.  Today's Vitals   11/04/19 1140  BP: 107/69  Pulse: 79  Resp: 16  Temp: 98.2 F (36.8 C)  TempSrc: Temporal  SpO2: 100%  Weight: 128 lb (58.1 kg)  Height: 5\' 1"  (1.549 m)   Body mass index is 24.19 kg/m.   Physical Exam Vitals reviewed. Exam conducted with a chaperone present.    Constitutional:      Appearance: Normal appearance.  HENT:     Head: Normocephalic.  Eyes:     Extraocular Movements: Extraocular movements intact.     Conjunctiva/sclera: Conjunctivae normal.     Pupils: Pupils are equal, round, and reactive to light.  Cardiovascular:     Rate and Rhythm: Normal rate and regular rhythm.     Pulses: Normal pulses.     Heart sounds: Normal heart sounds.  Pulmonary:     Effort: Pulmonary effort is normal.     Breath sounds: Normal breath sounds.  Chest:     Breasts: Breasts are symmetrical.  Right: Normal.        Left: Normal.  Abdominal:     General: Bowel sounds are normal. There is no distension.     Palpations: Abdomen is soft. There is no mass.     Tenderness: There is no abdominal tenderness.     Hernia: There is no hernia in the left inguinal area or right inguinal area.  Genitourinary:    General: Normal vulva.     Labia:        Right: No rash, tenderness or lesion.        Left: No rash, tenderness or lesion.      Vagina: Normal.     Cervix: Normal.     Uterus: Normal.      Adnexa: Right adnexa normal and left adnexa normal.  Musculoskeletal:        General: Normal range of motion.     Cervical back: Normal range of motion and neck supple. No tenderness.  Lymphadenopathy:     Cervical: No cervical adenopathy.     Upper Body:     Right upper body: No supraclavicular or axillary adenopathy.     Left upper body: No supraclavicular or axillary adenopathy.     Lower Body: No right inguinal adenopathy. No left inguinal adenopathy.  Skin:    General: Skin is warm and dry.     Capillary Refill: Capillary refill takes less than 2 seconds.  Neurological:     General: No focal deficit present.     Mental Status: She is alert and oriented to person, place, and time.  Psychiatric:        Mood and Affect: Mood normal.        Behavior: Behavior normal.      ASSESSMENT & PLAN: Analyse was seen today for annual exam.  Diagnoses  and all orders for this visit:  Routine general medical examination at a health care facility  Screening for deficiency anemia -     CBC with Differential/Platelet  Screening for lipoid disorders -     Lipid panel  Screening for endocrine, metabolic and immunity disorder -     Comprehensive metabolic panel -     TSH  Cervical cancer screening -     Cytology - PAP ([place])    Patient Instructions       If you have lab work done today you will be contacted with your lab results within the next 2 weeks.  If you have not heard from [place] then please contact us. The fastest way to get your results is to register for My Chart.   IF you received an x-ray today, you will receive an invoice from [facility]. Please contact [facility] at [phone] with questions or concerns regarding your invoice.   IF you received labwork today, you will receive an invoice from [facility]. Please contact [facility] at [phone] with questions or concerns regarding your invoice.   Our billing staff will not be able to assist you with questions regarding bills from these companies.  You will be contacted with the lab results as soon as they are available. The fastest way to get your results is to activate your My Chart account. Instructions are located on the last page of this paperwork. If you have not heard from [place] regarding the results in 2 weeks, please contact this office.      Health Maintenance, Female Adopting a healthy lifestyle and getting preventive care are important in promoting health and wellness. Ask your  health care provider about:  The right schedule for you to have regular tests and exams.  Things you can do on your own to prevent diseases and keep yourself healthy. What should I know about diet, weight, and exercise? Eat a healthy diet   Eat a diet that includes plenty of vegetables, fruits, low-fat dairy products, and lean protein.  Do not eat a lot  of foods that are high in solid fats, added sugars, or sodium. Maintain a healthy weight Body mass index (BMI) is used to identify weight problems. It estimates body fat based on height and weight. Your health care provider can help determine your BMI and help you achieve or maintain a healthy weight. Get regular exercise Get regular exercise. This is one of the most important things you can do for your health. Most adults should:  Exercise for at least 150 minutes each week. The exercise should increase your heart rate and make you sweat (moderate-intensity exercise).  Do strengthening exercises at least twice a week. This is in addition to the moderate-intensity exercise.  Spend less time sitting. Even light physical activity can be beneficial. Watch cholesterol and blood lipids Have your blood tested for lipids and cholesterol at 38 years of age, then have this test every 5 years. Have your cholesterol levels checked more often if:  Your lipid or cholesterol levels are high.  You are older than 38 years of age.  You are at high risk for heart disease. What should I know about cancer screening? Depending on your health history and family history, you may need to have cancer screening at various ages. This may include screening for:  Breast cancer.  Cervical cancer.  Colorectal cancer.  Skin cancer.  Lung cancer. What should I know about heart disease, diabetes, and high blood pressure? Blood pressure and heart disease  High blood pressure causes heart disease and increases the risk of stroke. This is more likely to develop in people who have high blood pressure readings, are of African descent, or are overweight.  Have your blood pressure checked: ? Every 3-5 years if you are 21-15 years of age. ? Every year if you are 58 years old or older. Diabetes Have regular diabetes screenings. This checks your fasting blood sugar level. Have the screening done:  Once every three  years after age 67 if you are at a normal weight and have a low risk for diabetes.  More often and at a younger age if you are overweight or have a high risk for diabetes. What should I know about preventing infection? Hepatitis B If you have a higher risk for hepatitis B, you should be screened for this virus. Talk with your health care provider to find out if you are at risk for hepatitis B infection. Hepatitis C Testing is recommended for:  Everyone born from [birth year] through 1964.  Anyone with known risk factors for hepatitis C. Sexually transmitted infections (STIs)  Get screened for STIs, including gonorrhea and chlamydia, if: ? You are sexually active and are younger than 38 years of age. ? You are older than 38 years of age and your health care provider tells you that you are at risk for this type of infection. ? Your sexual activity has changed since you were last screened, and you are at increased risk for chlamydia or gonorrhea. Ask your health care provider if you are at risk.  Ask your health care provider about whether you are at high risk for HIV.  Your health care provider may recommend a prescription medicine to help prevent HIV infection. If you choose to take medicine to prevent HIV, you should first get tested for HIV. You should then be tested every 3 months for as long as you are taking the medicine. Pregnancy  If you are about to stop having your period (premenopausal) and you may become pregnant, seek counseling before you get pregnant.  Take 400 to 800 micrograms (mcg) of folic acid every day if you become pregnant.  Ask for birth control (contraception) if you want to prevent pregnancy. Osteoporosis and menopause Osteoporosis is a disease in which the bones lose minerals and strength with aging. This can result in bone fractures. If you are 2 years old or older, or if you are at risk for osteoporosis and fractures, ask your health care provider if you should:  Be  screened for bone loss.  Take a calcium or vitamin D supplement to lower your risk of fractures.  Be given hormone replacement therapy (HRT) to treat symptoms of menopause. Follow these instructions at home: Lifestyle  Do not use any products that contain nicotine or tobacco, such as cigarettes, e-cigarettes, and chewing tobacco. If you need help quitting, ask your health care provider.  Do not use street drugs.  Do not share needles.  Ask your health care provider for help if you need support or information about quitting drugs. Alcohol use  Do not drink alcohol if: ? Your health care provider tells you not to drink. ? You are pregnant, may be pregnant, or are planning to become pregnant.  If you drink alcohol: ? Limit how much you use to 0-1 drink a day. ? Limit intake if you are breastfeeding.  Be aware of how much alcohol is in your drink. In the [place] one drink equals one 12 oz bottle of beer (355 mL), one 5 oz glass of wine (148 mL), or one 1 oz glass of hard liquor (44 mL). General instructions  Schedule regular health, dental, and eye exams.  Stay current with your vaccines.  Tell your health care provider if: ? You often feel depressed. ? You have ever been abused or do not feel safe at home. Summary  Adopting a healthy lifestyle and getting preventive care are important in promoting health and wellness.  Follow your health care provider's instructions about healthy diet, exercising, and getting tested or screened for diseases.  Follow your health care provider's instructions on monitoring your cholesterol and blood pressure. This information is not intended to replace advice given to you by your health care provider. Make sure you discuss any questions you have with your health care provider. Document Revised: 08/15/2018 Document Reviewed: 08/15/2018 Elsevier Patient Education  2020 Elsevier Inc.      Edwina Barth, MD [facility]

## 2019-11-04 NOTE — Patient Instructions (Addendum)
   If you have lab work done today you will be contacted with your lab results within the next 2 weeks.  If you have not heard from us then please contact us. The fastest way to get your results is to register for My Chart.   IF you received an x-ray today, you will receive an invoice from [facility]. Please contact [facility] at [phone] with questions or concerns regarding your invoice.   IF you received labwork today, you will receive an invoice from [facility]. Please contact [facility] at [phone] with questions or concerns regarding your invoice.   Our billing staff will not be able to assist you with questions regarding bills from these companies.  You will be contacted with the lab results as soon as they are available. The fastest way to get your results is to activate your My Chart account. Instructions are located on the last page of this paperwork. If you have not heard from us regarding the results in 2 weeks, please contact this office.       Health Maintenance, Female Adopting a healthy lifestyle and getting preventive care are important in promoting health and wellness. Ask your health care provider about:  The right schedule for you to have regular tests and exams.  Things you can do on your own to prevent diseases and keep yourself healthy. What should I know about diet, weight, and exercise? Eat a healthy diet   Eat a diet that includes plenty of vegetables, fruits, low-fat dairy products, and lean protein.  Do not eat a lot of foods that are high in solid fats, added sugars, or sodium. Maintain a healthy weight Body mass index (BMI) is used to identify weight problems. It estimates body fat based on height and weight. Your health care provider can help determine your BMI and help you achieve or maintain a healthy weight. Get regular exercise Get regular exercise. This is one of the most important things you can do for your health. Most  adults should:  Exercise for at least 150 minutes each week. The exercise should increase your heart rate and make you sweat (moderate-intensity exercise).  Do strengthening exercises at least twice a week. This is in addition to the moderate-intensity exercise.  Spend less time sitting. Even light physical activity can be beneficial. Watch cholesterol and blood lipids Have your blood tested for lipids and cholesterol at 38 years of age, then have this test every 5 years. Have your cholesterol levels checked more often if:  Your lipid or cholesterol levels are high.  You are older than 38 years of age.  You are at high risk for heart disease. What should I know about cancer screening? Depending on your health history and family history, you may need to have cancer screening at various ages. This may include screening for:  Breast cancer.  Cervical cancer.  Colorectal cancer.  Skin cancer.  Lung cancer. What should I know about heart disease, diabetes, and high blood pressure? Blood pressure and heart disease  High blood pressure causes heart disease and increases the risk of stroke. This is more likely to develop in people who have high blood pressure readings, are of African descent, or are overweight.  Have your blood pressure checked: ? Every 3-5 years if you are 18-39 years of age. ? Every year if you are 40 years old or older. Diabetes Have regular diabetes screenings. This checks your fasting blood sugar level. Have the screening done:  Once   every three years after age 40 if you are at a normal weight and have a low risk for diabetes.  More often and at a younger age if you are overweight or have a high risk for diabetes. What should I know about preventing infection? Hepatitis B If you have a higher risk for hepatitis B, you should be screened for this virus. Talk with your health care provider to find out if you are at risk for hepatitis B infection. Hepatitis  C Testing is recommended for:  Everyone born from 1945 through 1965.  Anyone with known risk factors for hepatitis C. Sexually transmitted infections (STIs)  Get screened for STIs, including gonorrhea and chlamydia, if: ? You are sexually active and are younger than 38 years of age. ? You are older than 38 years of age and your health care provider tells you that you are at risk for this type of infection. ? Your sexual activity has changed since you were last screened, and you are at increased risk for chlamydia or gonorrhea. Ask your health care provider if you are at risk.  Ask your health care provider about whether you are at high risk for HIV. Your health care provider may recommend a prescription medicine to help prevent HIV infection. If you choose to take medicine to prevent HIV, you should first get tested for HIV. You should then be tested every 3 months for as long as you are taking the medicine. Pregnancy  If you are about to stop having your period (premenopausal) and you may become pregnant, seek counseling before you get pregnant.  Take 400 to 800 micrograms (mcg) of folic acid every day if you become pregnant.  Ask for birth control (contraception) if you want to prevent pregnancy. Osteoporosis and menopause Osteoporosis is a disease in which the bones lose minerals and strength with aging. This can result in bone fractures. If you are 65 years old or older, or if you are at risk for osteoporosis and fractures, ask your health care provider if you should:  Be screened for bone loss.  Take a calcium or vitamin D supplement to lower your risk of fractures.  Be given hormone replacement therapy (HRT) to treat symptoms of menopause. Follow these instructions at home: Lifestyle  Do not use any products that contain nicotine or tobacco, such as cigarettes, e-cigarettes, and chewing tobacco. If you need help quitting, ask your health care provider.  Do not use street  drugs.  Do not share needles.  Ask your health care provider for help if you need support or information about quitting drugs. Alcohol use  Do not drink alcohol if: ? Your health care provider tells you not to drink. ? You are pregnant, may be pregnant, or are planning to become pregnant.  If you drink alcohol: ? Limit how much you use to 0-1 drink a day. ? Limit intake if you are breastfeeding.  Be aware of how much alcohol is in your drink. In the [place] one drink equals one 12 oz bottle of beer (355 mL), one 5 oz glass of wine (148 mL), or one 1 oz glass of hard liquor (44 mL). General instructions  Schedule regular health, dental, and eye exams.  Stay current with your vaccines.  Tell your health care provider if: ? You often feel depressed. ? You have ever been abused or do not feel safe at home. Summary  Adopting a healthy lifestyle and getting preventive care are important in promoting health and   wellness.  Follow your health care provider's instructions about healthy diet, exercising, and getting tested or screened for diseases.  Follow your health care provider's instructions on monitoring your cholesterol and blood pressure. This information is not intended to replace advice given to you by your health care provider. Make sure you discuss any questions you have with your health care provider. Document Revised: 08/15/2018 Document Reviewed: 08/15/2018 Elsevier Patient Education  2020 Elsevier Inc.  

## 2019-11-05 ENCOUNTER — Encounter: Payer: Self-pay | Admitting: Emergency Medicine

## 2019-11-05 LAB — CBC WITH DIFFERENTIAL/PLATELET
Basophils Absolute: 0 10*3/uL (ref 0.0–0.2)
Basos: 0 %
EOS (ABSOLUTE): 0.1 10*3/uL (ref 0.0–0.4)
Eos: 1 %
Hematocrit: 38.4 % (ref 34.0–46.6)
Hemoglobin: 12.4 g/dL (ref 11.1–15.9)
Immature Grans (Abs): 0 10*3/uL (ref 0.0–0.1)
Immature Granulocytes: 0 %
Lymphocytes Absolute: 2 10*3/uL (ref 0.7–3.1)
Lymphs: 21 %
MCH: 28.1 pg (ref 26.6–33.0)
MCHC: 32.3 g/dL (ref 31.5–35.7)
MCV: 87 fL (ref 79–97)
Monocytes Absolute: 0.4 10*3/uL (ref 0.1–0.9)
Monocytes: 4 %
Neutrophils Absolute: 6.9 10*3/uL (ref 1.4–7.0)
Neutrophils: 74 %
Platelets: 205 10*3/uL (ref 150–450)
RBC: 4.41 x10E6/uL (ref 3.77–5.28)
RDW: 12.7 % (ref 11.7–15.4)
WBC: 9.3 10*3/uL (ref 3.4–10.8)

## 2019-11-05 LAB — COMPREHENSIVE METABOLIC PANEL
ALT: 20 IU/L (ref 0–32)
AST: 21 IU/L (ref 0–40)
Albumin/Globulin Ratio: 1.6 (ref 1.2–2.2)
Albumin: 4.4 g/dL (ref 3.8–4.8)
Alkaline Phosphatase: 91 IU/L (ref 39–117)
BUN/Creatinine Ratio: 13 (ref 9–23)
BUN: 7 mg/dL (ref 6–20)
Bilirubin Total: 0.4 mg/dL (ref 0.0–1.2)
CO2: 19 mmol/L — ABNORMAL LOW (ref 20–29)
Calcium: 8.9 mg/dL (ref 8.7–10.2)
Chloride: 106 mmol/L (ref 96–106)
Creatinine, Ser: 0.55 mg/dL — ABNORMAL LOW (ref 0.57–1.00)
GFR calc Af Amer: 138 mL/min/{1.73_m2} (ref >59)
GFR calc non Af Amer: 119 mL/min/{1.73_m2} (ref >59)
Globulin, Total: 2.7 g/dL (ref 1.5–4.5)
Glucose: 88 mg/dL (ref 65–99)
Potassium: 4.2 mmol/L (ref 3.5–5.2)
Sodium: 138 mmol/L (ref 134–144)
Total Protein: 7.1 g/dL (ref 6.0–8.5)

## 2019-11-05 LAB — LIPID PANEL
Chol/HDL Ratio: 2.7 ratio (ref 0.0–4.4)
Cholesterol, Total: 134 mg/dL (ref 100–199)
HDL: 50 mg/dL (ref >39)
LDL Chol Calc (NIH): 68 mg/dL (ref 0–99)
Triglycerides: 83 mg/dL (ref 0–149)
VLDL Cholesterol Cal: 16 mg/dL (ref 5–40)

## 2019-11-05 LAB — CYTOLOGY - PAP: Diagnosis: NEGATIVE

## 2019-11-05 LAB — TSH: TSH: 1.97 u[IU]/mL (ref 0.450–4.500)

## 2019-11-10 ENCOUNTER — Ambulatory Visit: Payer: Self-pay

## 2019-11-11 ENCOUNTER — Telehealth: Payer: Self-pay

## 2019-11-11 NOTE — Telephone Encounter (Signed)
crm closed, no action required

## 2019-11-16 ENCOUNTER — Ambulatory Visit: Payer: Managed Care, Other (non HMO) | Attending: Internal Medicine

## 2019-11-16 ENCOUNTER — Other Ambulatory Visit: Payer: Self-pay

## 2019-11-16 DIAGNOSIS — Z23 Encounter for immunization: Secondary | ICD-10-CM

## 2019-11-16 NOTE — Progress Notes (Signed)
   Covid-19 Vaccination Clinic  Name:  Merriam Brandner    MRN: [id number] DOB: November 13, 1980  11/16/2019  Ms. Roppolo was observed post Covid-19 immunization for 15 minutes without incident. She was provided with Vaccine Information Sheet and instruction to access the V-Safe system.   Ms. Zurcher was instructed to call 911 with any severe reactions post vaccine: Marland Kitchen Difficulty breathing  . Swelling of face and throat  . A fast heartbeat  . A bad rash all over body  . Dizziness and weakness   Immunizations Administered    Name Date Dose VIS Date Route   Pfizer COVID-19 Vaccine 11/16/2019  1:20 PM 0.3 mL 08/16/2019 Intramuscular   Manufacturer: [employer], [employer]   Lot: [id number]   NDC: [address]-4

## 2019-12-10 ENCOUNTER — Ambulatory Visit: Payer: Managed Care, Other (non HMO) | Attending: Internal Medicine

## 2019-12-10 DIAGNOSIS — Z23 Encounter for immunization: Secondary | ICD-10-CM

## 2019-12-10 NOTE — Progress Notes (Signed)
   Covid-19 Vaccination Clinic  Name:  Penny Bishop    MRN: [id number] DOB: [birth year]  12/10/2019  Penny Bishop was observed post Covid-19 immunization for 15 minutes without incident. She was provided with Vaccine Information Sheet and instruction to access the V-Safe system.   Penny Bishop was instructed to call 911 with any severe reactions post vaccine: Marland Kitchen Difficulty breathing  . Swelling of face and throat  . A fast heartbeat  . A bad rash all over body  . Dizziness and weakness   Immunizations Administered    Name Date Dose VIS Date Route   Pfizer COVID-19 Vaccine 12/10/2019  1:46 PM 0.3 mL 08/16/2019 Intramuscular   Manufacturer: [employer], [employer]   Lot: [id number]   NDC: [address]-4

## 2020-03-19 ENCOUNTER — Encounter: Payer: Self-pay | Admitting: Family Medicine

## 2020-03-19 ENCOUNTER — Ambulatory Visit: Payer: Managed Care, Other (non HMO) | Admitting: Family Medicine

## 2020-03-19 ENCOUNTER — Other Ambulatory Visit: Payer: Self-pay

## 2020-03-19 ENCOUNTER — Ambulatory Visit (INDEPENDENT_AMBULATORY_CARE_PROVIDER_SITE_OTHER): Payer: Managed Care, Other (non HMO)

## 2020-03-19 VITALS — BP 103/68 | HR 85 | Temp 97.9°F | Ht 61.0 in | Wt 131.8 lb

## 2020-03-19 DIAGNOSIS — M7712 Lateral epicondylitis, left elbow: Secondary | ICD-10-CM | POA: Diagnosis not present

## 2020-03-19 DIAGNOSIS — M25522 Pain in left elbow: Secondary | ICD-10-CM

## 2020-03-19 NOTE — Progress Notes (Signed)
Patient ID: Penny Bishop, female    DOB: 1981/04/30  Age: 39 y.o. MRN: [id number]  Chief Complaint  Patient presents with  . Elbow Pain    Pt stated that she hit her elbow on a door about 1 month ago. She stated that she thought that the pain would go away and the pain has gotten worse over time.and at times it is hard to lift things.    Subjective:   Patient has had problems with her left elbow hurting some off and on for 25 years.  However a month ago she bumped it 3 times on a door somehow.  It is continued to hurt terribly since then.  Hard time holding things.  It hurts when she rolls over on it at night.  Current allergies, medications, problem list, past/family and social histories reviewed.  Objective:  BP 103/68 (BP Location: Right Arm, Patient Position: Sitting, Cuff Size: Normal)   Pulse 85   Temp 97.9 F (36.6 C) (Temporal)   Ht 5\' 1"  (1.549 m)   Wt 131 lb 12.8 oz (59.8 kg)   LMP 02/25/2020   SpO2 95%   BMI 24.90 kg/m   Very tender over the lateral upper condyle at the left elbow.  Pronation and supination causes extreme pain.  Neurovascular intact.  X-ray negative.  Assessment & Plan:   Assessment: 1. Lateral epicondylitis of left elbow   2. Elbow pain, left       Plan: See instructions.  A steroid shot would probably have helped the most but she does not want 1.  Orders Placed This Encounter  Procedures  . DG ELBOW COMPLETE LEFT (3+VIEW)    Order Specific Question:   Reason for Exam (SYMPTOM  OR DIAGNOSIS REQUIRED)    Answer:   Left elbow pain    Order Specific Question:   Is patient pregnant?    Answer:   No    Order Specific Question:   Preferred imaging location?    Answer:   External    Order Specific Question:   Radiology Contrast Protocol - do NOT remove file path    Answer:   [email or web address]    No orders of the defined types were placed in this encounter.        Patient Instructions    This  is lateral epicondylitis.  Take over-the-counter naproxen (Aleve) 2 pills twice daily for pain and inflammation  Rub some diclofenac gel (Voltaren gel) in several times daily.  This can be obtained over-the-counter  Get a elbow strap which should be available at a pharmacy or possibly at a big sporting goods store.  Apply ice  Read the below handout on lateral epicondylitis (tennis elbow)  The next step is to inject some cortisone.  This can be done here or by a specialist.  If symptoms persist we can refer her to sports medicine specialist and physical therapy.   If you have lab work done today you will be contacted with your lab results within the next 2 weeks.  If you have not heard from 02/27/2020 then please contact us. The fastest way to get your results is to register for My Chart.   IF you received an x-ray today, you will receive an invoice from [facility]. Please contact [facility] at [phone] Tennis Elbow  Tennis elbow (lateral epicondylitis) is inflammation of tendons in your outer forearm, near your elbow. Tendons are tissues that connect muscle to bone. When you have tennis elbow, inflammation affects the tendons  that you use to bend your wrist and move your hand up. Inflammation occurs in the lower part of the upper arm bone (humerus), where the tendons connect to the bone (lateral epicondyle). Tennis elbow often affects people who play tennis, but anyone may get the condition from repeatedly extending the wrist or turning the forearm. What are the causes? This condition is usually caused by repeatedly extending the wrist, turning the forearm, and using the hands. It can result from sports or work that requires repetitive forearm movements. In some cases, it may be caused by a sudden injury. What increases the risk? You are more likely to develop tennis elbow if you play tennis or another racket sport. You also have a higher risk if you frequently use your hands for  work. Besides people who play tennis, others at greater risk include:  Musicians.  Carpenters, painters, and plumbers.  Cooks.  Cashiers.  People who work in [employer].  Holiday representative workers.  Butchers.  People who use computers. What are the signs or symptoms? Symptoms of this condition include:  Pain and tenderness in the forearm and the outer part of the elbow. Pain may be felt only when using the arm, or it may be there all the time.  A burning feeling that starts in the elbow and spreads down the forearm.  A weak grip in the hand. How is this diagnosed? This condition may be diagnosed based on:  Your symptoms and medical history.  A physical exam.  X-rays.  MRI. How is this treated? Resting and icing your arm is often the first treatment. Your health care provider may also recommend:  Medicines to reduce pain and inflammation. These may be in the form of a pill, topical gels, or shots of a steroid medicine (cortisone).  An elbow strap to reduce stress on the area.  Physical therapy. This may include massage or exercises.  An elbow brace to restrict the movements that cause symptoms. If these treatments do not help relieve your symptoms, your health care provider may recommend surgery to remove damaged muscle and reattach healthy muscle to bone. Follow these instructions at home: Activity  Rest your elbow and wrist and avoid activities that cause symptoms, as told by your health care provider.  Do physical therapy exercises as instructed.  If you lift an object, lift it with your palm facing up. This reduces stress on your elbow. Lifestyle  If your tennis elbow is caused by sports, check your equipment and make sure that: ? You are using it correctly. ? It is the best fit for you.  If your tennis elbow is caused by work or computer use, take frequent breaks to stretch your arm. Talk with your manager about ways to manage your condition at work. If you  have a brace:  Wear the brace or strap as told by your health care provider. Remove it only as told by your health care provider.  Loosen the brace if your fingers tingle, become numb, or turn cold and blue.  Keep the brace clean.  If the brace is not waterproof, ask if you may remove it for bathing. If you must keep the brace on while bathing: ? Do not let it get wet. ? Cover it with a watertight covering when you take a bath or a shower. General instructions   If directed, put ice on the painful area: ? Put ice in a plastic bag. ? Place a towel between your skin and the bag. ?  Leave the ice on for 20 minutes, 2-3 times a day.  Take over-the-counter and prescription medicines only as told by your health care provider.  Keep all follow-up visits as told by your health care provider. This is important. Contact a health care provider if:  You have pain that gets worse or does not get better with treatment.  You have numbness or weakness in your forearm, hand, or fingers. Summary  Tennis elbow (lateral epicondylitis) is inflammation of tendons in your outer forearm, near your elbow.  Common symptoms include pain and tenderness in your forearm and the outer part of your elbow.  This condition is usually caused by repeatedly extending your wrist, turning your forearm, and using your hands.  The first treatment is often resting and icing your arm to relieve symptoms. Further treatment may include taking medicine, getting physical therapy, wearing a brace or strap, or having surgery. This information is not intended to replace advice given to you by your health care provider. Make sure you discuss any questions you have with your health care provider. Document Revised: 05/18/2018 Document Reviewed: 06/06/2017 Elsevier Patient Education  [employer]. [phone] with questions or concerns regarding your invoice.   IF you received labwork today, you will receive an invoice from  [place]. Please contact [facility] at [phone] with questions or concerns regarding your invoice.   Our billing staff will not be able to assist you with questions regarding bills from these companies.  You will be contacted with the lab results as soon as they are available. The fastest way to get your results is to activate your My Chart account. Instructions are located on the last page of this paperwork. If you have not heard from [place] regarding the results in 2 weeks, please contact this office.        Return if symptoms worsen or fail to improve.   Janace Hoard, MD 03/19/2020

## 2020-03-19 NOTE — Patient Instructions (Addendum)
This is lateral epicondylitis.  Take over-the-counter naproxen (Aleve) 2 pills twice daily for pain and inflammation  Rub some diclofenac gel (Voltaren gel) in several times daily.  This can be obtained over-the-counter  Get a elbow strap which should be available at a pharmacy or possibly at a big sporting goods store.  Apply ice  Read the below handout on lateral epicondylitis (tennis elbow)  The next step is to inject some cortisone.  This can be done here or by a specialist.  If symptoms persist we can refer her to sports medicine specialist and physical therapy.   If you have lab work done today you will be contacted with your lab results within the next 2 weeks.  If you have not heard from [place] then please contact us. The fastest way to get your results is to register for My Chart.   IF you received an x-ray today, you will receive an invoice from [facility]. Please contact [facility] at [phone] Tennis Elbow  Tennis elbow (lateral epicondylitis) is inflammation of tendons in your outer forearm, near your elbow. Tendons are tissues that connect muscle to bone. When you have tennis elbow, inflammation affects the tendons that you use to bend your wrist and move your hand up. Inflammation occurs in the lower part of the upper arm bone (humerus), where the tendons connect to the bone (lateral epicondyle). Tennis elbow often affects people who play tennis, but anyone may get the condition from repeatedly extending the wrist or turning the forearm. What are the causes? This condition is usually caused by repeatedly extending the wrist, turning the forearm, and using the hands. It can result from sports or work that requires repetitive forearm movements. In some cases, it may be caused by a sudden injury. What increases the risk? You are more likely to develop tennis elbow if you play tennis or another racket sport. You also have a higher risk if you frequently use your  hands for work. Besides people who play tennis, others at greater risk include:  Musicians.  Carpenters, painters, and plumbers.  Cooks.  Cashiers.  People who work in [employer].  Holiday representative workers.  Butchers.  People who use computers. What are the signs or symptoms? Symptoms of this condition include:  Pain and tenderness in the forearm and the outer part of the elbow. Pain may be felt only when using the arm, or it may be there all the time.  A burning feeling that starts in the elbow and spreads down the forearm.  A weak grip in the hand. How is this diagnosed? This condition may be diagnosed based on:  Your symptoms and medical history.  A physical exam.  X-rays.  MRI. How is this treated? Resting and icing your arm is often the first treatment. Your health care provider may also recommend:  Medicines to reduce pain and inflammation. These may be in the form of a pill, topical gels, or shots of a steroid medicine (cortisone).  An elbow strap to reduce stress on the area.  Physical therapy. This may include massage or exercises.  An elbow brace to restrict the movements that cause symptoms. If these treatments do not help relieve your symptoms, your health care provider may recommend surgery to remove damaged muscle and reattach healthy muscle to bone. Follow these instructions at home: Activity  Rest your elbow and wrist and avoid activities that cause symptoms, as told by your health care provider.  Do physical therapy exercises as instructed.  If you lift an object, lift it with your palm facing up. This reduces stress on your elbow. Lifestyle  If your tennis elbow is caused by sports, check your equipment and make sure that: ? You are using it correctly. ? It is the best fit for you.  If your tennis elbow is caused by work or computer use, take frequent breaks to stretch your arm. Talk with your manager about ways to manage your condition at  work. If you have a brace:  Wear the brace or strap as told by your health care provider. Remove it only as told by your health care provider.  Loosen the brace if your fingers tingle, become numb, or turn cold and blue.  Keep the brace clean.  If the brace is not waterproof, ask if you may remove it for bathing. If you must keep the brace on while bathing: ? Do not let it get wet. ? Cover it with a watertight covering when you take a bath or a shower. General instructions   If directed, put ice on the painful area: ? Put ice in a plastic bag. ? Place a towel between your skin and the bag. ? Leave the ice on for 20 minutes, 2-3 times a day.  Take over-the-counter and prescription medicines only as told by your health care provider.  Keep all follow-up visits as told by your health care provider. This is important. Contact a health care provider if:  You have pain that gets worse or does not get better with treatment.  You have numbness or weakness in your forearm, hand, or fingers. Summary  Tennis elbow (lateral epicondylitis) is inflammation of tendons in your outer forearm, near your elbow.  Common symptoms include pain and tenderness in your forearm and the outer part of your elbow.  This condition is usually caused by repeatedly extending your wrist, turning your forearm, and using your hands.  The first treatment is often resting and icing your arm to relieve symptoms. Further treatment may include taking medicine, getting physical therapy, wearing a brace or strap, or having surgery. This information is not intended to replace advice given to you by your health care provider. Make sure you discuss any questions you have with your health care provider. Document Revised: 05/18/2018 Document Reviewed: 06/06/2017 Elsevier Patient Education  [employer]. [phone] with questions or concerns regarding your invoice.   IF you received labwork today, you will receive an  invoice from [place]. Please contact [facility] at [phone] with questions or concerns regarding your invoice.   Our billing staff will not be able to assist you with questions regarding bills from these companies.  You will be contacted with the lab results as soon as they are available. The fastest way to get your results is to activate your My Chart account. Instructions are located on the last page of this paperwork. If you have not heard from [place] regarding the results in 2 weeks, please contact this office.

## 2020-05-21 ENCOUNTER — Ambulatory Visit
Admission: RE | Admit: 2020-05-21 | Discharge: 2020-05-21 | Disposition: A | Discharge status: Home / Self Care | Payer: BC Managed Care – PPO | Source: Ambulatory Visit | Attending: Cardiovascular Disease | Admitting: Cardiovascular Disease

## 2020-05-21 ENCOUNTER — Other Ambulatory Visit: Payer: Self-pay | Admitting: Cardiovascular Disease

## 2020-05-21 DIAGNOSIS — R059 Cough, unspecified: Secondary | ICD-10-CM

## 2020-05-21 DIAGNOSIS — Z8701 Personal history of pneumonia (recurrent): Secondary | ICD-10-CM

## 2020-11-09 ENCOUNTER — Other Ambulatory Visit: Payer: Self-pay

## 2020-11-09 ENCOUNTER — Ambulatory Visit (INDEPENDENT_AMBULATORY_CARE_PROVIDER_SITE_OTHER): Payer: Managed Care, Other (non HMO) | Admitting: Emergency Medicine

## 2020-11-09 ENCOUNTER — Encounter: Payer: Self-pay | Admitting: Emergency Medicine

## 2020-11-09 VITALS — BP 107/67 | HR 84 | Temp 98.0°F | Ht 62.0 in | Wt 135.0 lb

## 2020-11-09 DIAGNOSIS — Z8742 Personal history of other diseases of the female genital tract: Secondary | ICD-10-CM

## 2020-11-09 DIAGNOSIS — Z13228 Encounter for screening for other metabolic disorders: Secondary | ICD-10-CM

## 2020-11-09 DIAGNOSIS — Z13220 Encounter for screening for lipoid disorders: Secondary | ICD-10-CM | POA: Diagnosis not present

## 2020-11-09 DIAGNOSIS — Z0001 Encounter for general adult medical examination with abnormal findings: Secondary | ICD-10-CM

## 2020-11-09 DIAGNOSIS — E782 Mixed hyperlipidemia: Secondary | ICD-10-CM

## 2020-11-09 DIAGNOSIS — Z1329 Encounter for screening for other suspected endocrine disorder: Secondary | ICD-10-CM

## 2020-11-09 DIAGNOSIS — Z0000 Encounter for general adult medical examination without abnormal findings: Secondary | ICD-10-CM

## 2020-11-09 DIAGNOSIS — Z130 Encounter for screening for diseases of the blood and blood-forming organs and certain disorders involving the immune mechanism: Secondary | ICD-10-CM

## 2020-11-09 NOTE — Progress Notes (Signed)
Penny Bishop 40 y.o.   Chief Complaint  Patient presents with  . Annual Exam    HISTORY OF PRESENT ILLNESS: This is a 40 y.o. female here for annual exam. Has history of PCOS diagnosed in 2008.  Needs Endo referral. Has no complaints or medical concerns today.  HPI   Prior to Admission medications   Medication Sig Start Date End Date Taking? Authorizing Provider  albuterol (VENTOLIN HFA) 108 (90 Base) MCG/ACT inhaler Inhale 2 puffs into the lungs every 6 (six) hours as needed for wheezing or shortness of breath. 08/30/19  Yes Orpah Cobb, MD  atorvastatin (LIPITOR) 40 MG tablet Take 1 tablet (40 mg total) by mouth daily at 6 PM. 08/30/19  Yes Orpah Cobb, MD  B Complex Vitamins (VITAMIN B COMPLEX) TABS Take 1 tablet by mouth daily.    Yes [provider]  Biotin 5000 MCG CAPS Take 5,000 mcg by mouth daily.    Yes [provider]  fluticasone (FLONASE) 50 MCG/ACT nasal spray Place 1 spray into both nostrils daily. As needed. 08/31/19  Yes Orpah Cobb, MD  iron polysaccharides (NIFEREX) 150 MG capsule Take 150 mg by mouth daily.   Yes [provider]  levothyroxine (SYNTHROID) 25 MCG tablet Take 25 mcg by mouth daily. 08/21/19  Yes [provider]  loratadine (CLARITIN) 10 MG tablet Take 1 tablet (10 mg total) by mouth daily. As needed. 08/31/19  Yes Orpah Cobb, MD  meclizine (ANTIVERT) 25 MG tablet Take 1 tablet (25 mg total) by mouth 3 (three) times daily as needed for dizziness. 08/30/19  Yes Orpah Cobb, MD  Multiple Vitamin (MULTIVITAMIN WITH MINERALS) TABS tablet Take 1 tablet by mouth daily.   Yes [provider]  rizatriptan (MAXALT) 5 MG tablet Take 1 tablet (5 mg total) by mouth as needed for migraine. May repeat in 2 hours if needed 05/09/19  Yes [name], MD  Vitamin D-Vitamin K (VITAMIN K2-VITAMIN D3 PO) Take 1 tablet by mouth daily.   Yes [provider]    Allergies  Allergen Reactions  .  Chlorhexidine Gluconate Rash    There are no problems to display for this patient.   Past Medical History:  Diagnosis Date  . Hepatitis B ~ 2001  . Iron deficiency anemia   . Maternal fever during labor 03/20/2011  . Pneumonia 08/20/2014  . Pneumonia involving right lung 08/20/2014  . Status post repeat low transverse cesarean section 03/23/2011    Past Surgical History:  Procedure Laterality Date  . CESAREAN SECTION  04/05/2009;   . CESAREAN SECTION  03/20/2011   Procedure: CESAREAN SECTION;  Surgeon: [name], MD;  Location: [facility];  Service: Gynecology;  Laterality: N/A;  cord ph 7.18, fundal massage by [employer]  . COSMETIC SURGERY      Social History   Socioeconomic History  . Marital status: Married    Spouse name: Not on file  . Number of children: Not on file  . Years of education: Not on file  . Highest education level: Not on file  Occupational History  . Occupation: Academic librarian  Tobacco Use  . Smoking status: Never Smoker  . Smokeless tobacco: Never Used  Vaping Use  . Vaping Use: Not on file  Substance and Sexual Activity  . Alcohol use: No  . Drug use: No  . Sexual activity: Yes  Other Topics Concern  . Not on file  Social History Narrative  . Not on file  Social Determinants of Health   Financial Resource Strain: Not on file  Food Insecurity: Not on file  Transportation Needs: Not on file  Physical Activity: Not on file  Stress: Not on file  Social Connections: Not on file  Intimate Partner Violence: Not on file    Family History  Problem Relation Age of Onset  . Diabetes Mother   . Hypertension Mother   . Hypertension Father   . Diabetes Maternal Grandmother   . Hypertension Maternal Grandmother   . Diabetes Maternal Grandfather   . Hypertension Maternal Grandfather   . Hypertension Paternal Grandmother   . Diabetes Paternal Grandmother   . COPD Paternal Grandfather   . Cancer Paternal Grandfather        throat      Review of Systems  Constitutional: Negative.  Negative for chills and fever.  HENT: Negative.  Negative for congestion and sore throat.   Respiratory: Negative.  Negative for cough and shortness of breath.   Cardiovascular: Negative.  Negative for chest pain and palpitations.  Gastrointestinal: Negative.  Negative for abdominal pain, diarrhea, nausea and vomiting.  Genitourinary: Negative.  Negative for dysuria and hematuria.  Musculoskeletal: Negative.  Negative for back pain, myalgias and neck pain.  Skin: Negative.  Negative for rash.  Neurological: Negative.  Negative for dizziness and headaches.  All other systems reviewed and are negative.   Today's Vitals   11/09/20 0923  BP: 107/67  Pulse: 84  Temp: 98 F (36.7 C)  TempSrc: Temporal  SpO2: 99%  Weight: 135 lb (61.2 kg)  Height: 5\' 2"  (1.575 m)   Body mass index is 24.69 kg/m.  Physical Exam Vitals reviewed.  Constitutional:      Appearance: Normal appearance.  HENT:     Head: Normocephalic.     Mouth/Throat:     Mouth: Mucous membranes are moist.     Pharynx: Oropharynx is clear.  Eyes:     Extraocular Movements: Extraocular movements intact.     Conjunctiva/sclera: Conjunctivae normal.     Pupils: Pupils are equal, round, and reactive to light.  Cardiovascular:     Rate and Rhythm: Normal rate and regular rhythm.     Pulses: Normal pulses.     Heart sounds: Normal heart sounds.  Pulmonary:     Effort: Pulmonary effort is normal.     Breath sounds: Normal breath sounds.  Abdominal:     General: Bowel sounds are normal. There is no distension.     Palpations: Abdomen is soft.     Tenderness: There is no abdominal tenderness.  Musculoskeletal:        General: Normal range of motion.     Cervical back: Normal range of motion and neck supple. No tenderness.  Lymphadenopathy:     Cervical: No cervical adenopathy.  Skin:    General: Skin is warm and dry.     Capillary Refill: Capillary refill takes  less than 2 seconds.  Neurological:     General: No focal deficit present.     Mental Status: She is alert and oriented to person, place, and time.  Psychiatric:        Mood and Affect: Mood normal.        Behavior: Behavior normal.      ASSESSMENT & PLAN: Penny Bishop was seen today for annual exam.  Diagnoses and all orders for this visit:  Routine general medical examination at a health care facility  Screening for endocrine, metabolic and immunity disorder -  Comprehensive metabolic panel  Screening for lipoid disorders  Elevated triglycerides with high cholesterol -     Lipid Panel  History of PCOS -     Ambulatory referral to Endocrinology -     TSH -     TestT+TestF+SHBG    Patient Instructions       If you have lab work done today you will be contacted with your lab results within the next 2 weeks.  If you have not heard from [place] then please contact us. The fastest way to get your results is to register for My Chart.   IF you received an x-ray today, you will receive an invoice from [facility]. Please contact [facility] at [phone] with questions or concerns regarding your invoice.   IF you received labwork today, you will receive an invoice from [facility]. Please contact [facility] at [phone] with questions or concerns regarding your invoice.   Our billing staff will not be able to assist you with questions regarding bills from these companies.  You will be contacted with the lab results as soon as they are available. The fastest way to get your results is to activate your My Chart account. Instructions are located on the last page of this paperwork. If you have not heard from [place] regarding the results in 2 weeks, please contact this office.       Health Maintenance, Female Adopting a healthy lifestyle and getting preventive care are important in promoting health and wellness. Ask your health care provider about:  The right  schedule for you to have regular tests and exams.  Things you can do on your own to prevent diseases and keep yourself healthy. What should I know about diet, weight, and exercise? Eat a healthy diet  Eat a diet that includes plenty of vegetables, fruits, low-fat dairy products, and lean protein.  Do not eat a lot of foods that are high in solid fats, added sugars, or sodium.   Maintain a healthy weight Body mass index (BMI) is used to identify weight problems. It estimates body fat based on height and weight. Your health care provider can help determine your BMI and help you achieve or maintain a healthy weight. Get regular exercise Get regular exercise. This is one of the most important things you can do for your health. Most adults should:  Exercise for at least 150 minutes each week. The exercise should increase your heart rate and make you sweat (moderate-intensity exercise).  Do strengthening exercises at least twice a week. This is in addition to the moderate-intensity exercise.  Spend less time sitting. Even light physical activity can be beneficial. Watch cholesterol and blood lipids Have your blood tested for lipids and cholesterol at 39 years of age, then have this test every 5 years. Have your cholesterol levels checked more often if:  Your lipid or cholesterol levels are high.  You are older than 39 years of age.  You are at high risk for heart disease. What should I know about cancer screening? Depending on your health history and family history, you may need to have cancer screening at various ages. This may include screening for:  Breast cancer.  Cervical cancer.  Colorectal cancer.  Skin cancer.  Lung cancer. What should I know about heart disease, diabetes, and high blood pressure? Blood pressure and heart disease  High blood pressure causes heart disease and increases the risk of stroke. This is more likely to develop in people who have high blood  pressure readings, are of African descent, or are overweight.  Have your blood pressure checked: ? Every 3-5 years if you are 69-69 years of age. ? Every year if you are 61 years old or older. Diabetes Have regular diabetes screenings. This checks your fasting blood sugar level. Have the screening done:  Once every three years after age 56 if you are at a normal weight and have a low risk for diabetes.  More often and at a younger age if you are overweight or have a high risk for diabetes. What should I know about preventing infection? Hepatitis B If you have a higher risk for hepatitis B, you should be screened for this virus. Talk with your health care provider to find out if you are at risk for hepatitis B infection. Hepatitis C Testing is recommended for:  Everyone born from [birth year] through 1964.  Anyone with known risk factors for hepatitis C. Sexually transmitted infections (STIs)  Get screened for STIs, including gonorrhea and chlamydia, if: ? You are sexually active and are younger than 39 years of age. ? You are older than 39 years of age and your health care provider tells you that you are at risk for this type of infection. ? Your sexual activity has changed since you were last screened, and you are at increased risk for chlamydia or gonorrhea. Ask your health care provider if you are at risk.  Ask your health care provider about whether you are at high risk for HIV. Your health care provider may recommend a prescription medicine to help prevent HIV infection. If you choose to take medicine to prevent HIV, you should first get tested for HIV. You should then be tested every 3 months for as long as you are taking the medicine. Pregnancy  If you are about to stop having your period (premenopausal) and you may become pregnant, seek counseling before you get pregnant.  Take 400 to 800 micrograms (mcg) of folic acid every day if you become pregnant.  Ask for birth control  (contraception) if you want to prevent pregnancy. Osteoporosis and menopause Osteoporosis is a disease in which the bones lose minerals and strength with aging. This can result in bone fractures. If you are 58 years old or older, or if you are at risk for osteoporosis and fractures, ask your health care provider if you should:  Be screened for bone loss.  Take a calcium or vitamin D supplement to lower your risk of fractures.  Be given hormone replacement therapy (HRT) to treat symptoms of menopause. Follow these instructions at home: Lifestyle  Do not use any products that contain nicotine or tobacco, such as cigarettes, e-cigarettes, and chewing tobacco. If you need help quitting, ask your health care provider.  Do not use street drugs.  Do not share needles.  Ask your health care provider for help if you need support or information about quitting drugs. Alcohol use  Do not drink alcohol if: ? Your health care provider tells you not to drink. ? You are pregnant, may be pregnant, or are planning to become pregnant.  If you drink alcohol: ? Limit how much you use to 0-1 drink a day. ? Limit intake if you are breastfeeding.  Be aware of how much alcohol is in your drink. In the [place] one drink equals one 12 oz bottle of beer (355 mL), one 5 oz glass of wine (148 mL), or one 1 oz glass of hard liquor (44 mL). General instructions  Schedule regular health, dental, and eye exams.  Stay current with your vaccines.  Tell your health care provider if: ? You often feel depressed. ? You have ever been abused or do not feel safe at home. Summary  Adopting a healthy lifestyle and getting preventive care are important in promoting health and wellness.  Follow your health care provider's instructions about healthy diet, exercising, and getting tested or screened for diseases.  Follow your health care provider's instructions on monitoring your cholesterol and blood pressure. This  information is not intended to replace advice given to you by your health care provider. Make sure you discuss any questions you have with your health care provider. Document Revised: 08/15/2018 Document Reviewed: 08/15/2018 Elsevier Patient Education  2021 Elsevier Inc.      Edwina BarthMiguel Evart Mcdonnell, MD [facility]

## 2020-11-09 NOTE — Patient Instructions (Addendum)
   If you have lab work done today you will be contacted with your lab results within the next 2 weeks.  If you have not heard from us then please contact us. The fastest way to get your results is to register for My Chart.   IF you received an x-ray today, you will receive an invoice from [facility]. Please contact [facility] at [phone] with questions or concerns regarding your invoice.   IF you received labwork today, you will receive an invoice from [facility]. Please contact [facility] at [phone] with questions or concerns regarding your invoice.   Our billing staff will not be able to assist you with questions regarding bills from these companies.  You will be contacted with the lab results as soon as they are available. The fastest way to get your results is to activate your My Chart account. Instructions are located on the last page of this paperwork. If you have not heard from us regarding the results in 2 weeks, please contact this office.     Health Maintenance, Female Adopting a healthy lifestyle and getting preventive care are important in promoting health and wellness. Ask your health care provider about:  The right schedule for you to have regular tests and exams.  Things you can do on your own to prevent diseases and keep yourself healthy. What should I know about diet, weight, and exercise? Eat a healthy diet  Eat a diet that includes plenty of vegetables, fruits, low-fat dairy products, and lean protein.  Do not eat a lot of foods that are high in solid fats, added sugars, or sodium.   Maintain a healthy weight Body mass index (BMI) is used to identify weight problems. It estimates body fat based on height and weight. Your health care provider can help determine your BMI and help you achieve or maintain a healthy weight. Get regular exercise Get regular exercise. This is one of the most important things you can do for your health. Most  adults should:  Exercise for at least 150 minutes each week. The exercise should increase your heart rate and make you sweat (moderate-intensity exercise).  Do strengthening exercises at least twice a week. This is in addition to the moderate-intensity exercise.  Spend less time sitting. Even light physical activity can be beneficial. Watch cholesterol and blood lipids Have your blood tested for lipids and cholesterol at 39 years of age, then have this test every 5 years. Have your cholesterol levels checked more often if:  Your lipid or cholesterol levels are high.  You are older than 40 years of age.  You are at high risk for heart disease. What should I know about cancer screening? Depending on your health history and family history, you may need to have cancer screening at various ages. This may include screening for:  Breast cancer.  Cervical cancer.  Colorectal cancer.  Skin cancer.  Lung cancer. What should I know about heart disease, diabetes, and high blood pressure? Blood pressure and heart disease  High blood pressure causes heart disease and increases the risk of stroke. This is more likely to develop in people who have high blood pressure readings, are of African descent, or are overweight.  Have your blood pressure checked: ? Every 3-5 years if you are 18-39 years of age. ? Every year if you are 40 years old or older. Diabetes Have regular diabetes screenings. This checks your fasting blood sugar level. Have the screening done:  Once every   three years after age 40 if you are at a normal weight and have a low risk for diabetes.  More often and at a younger age if you are overweight or have a high risk for diabetes. What should I know about preventing infection? Hepatitis B If you have a higher risk for hepatitis B, you should be screened for this virus. Talk with your health care provider to find out if you are at risk for hepatitis B infection. Hepatitis  C Testing is recommended for:  Everyone born from 1945 through 1965.  Anyone with known risk factors for hepatitis C. Sexually transmitted infections (STIs)  Get screened for STIs, including gonorrhea and chlamydia, if: ? You are sexually active and are younger than 39 years of age. ? You are older than 39 years of age and your health care provider tells you that you are at risk for this type of infection. ? Your sexual activity has changed since you were last screened, and you are at increased risk for chlamydia or gonorrhea. Ask your health care provider if you are at risk.  Ask your health care provider about whether you are at high risk for HIV. Your health care provider may recommend a prescription medicine to help prevent HIV infection. If you choose to take medicine to prevent HIV, you should first get tested for HIV. You should then be tested every 3 months for as long as you are taking the medicine. Pregnancy  If you are about to stop having your period (premenopausal) and you may become pregnant, seek counseling before you get pregnant.  Take 400 to 800 micrograms (mcg) of folic acid every day if you become pregnant.  Ask for birth control (contraception) if you want to prevent pregnancy. Osteoporosis and menopause Osteoporosis is a disease in which the bones lose minerals and strength with aging. This can result in bone fractures. If you are 65 years old or older, or if you are at risk for osteoporosis and fractures, ask your health care provider if you should:  Be screened for bone loss.  Take a calcium or vitamin D supplement to lower your risk of fractures.  Be given hormone replacement therapy (HRT) to treat symptoms of menopause. Follow these instructions at home: Lifestyle  Do not use any products that contain nicotine or tobacco, such as cigarettes, e-cigarettes, and chewing tobacco. If you need help quitting, ask your health care provider.  Do not use street  drugs.  Do not share needles.  Ask your health care provider for help if you need support or information about quitting drugs. Alcohol use  Do not drink alcohol if: ? Your health care provider tells you not to drink. ? You are pregnant, may be pregnant, or are planning to become pregnant.  If you drink alcohol: ? Limit how much you use to 0-1 drink a day. ? Limit intake if you are breastfeeding.  Be aware of how much alcohol is in your drink. In the [place] one drink equals one 12 oz bottle of beer (355 mL), one 5 oz glass of wine (148 mL), or one 1 oz glass of hard liquor (44 mL). General instructions  Schedule regular health, dental, and eye exams.  Stay current with your vaccines.  Tell your health care provider if: ? You often feel depressed. ? You have ever been abused or do not feel safe at home. Summary  Adopting a healthy lifestyle and getting preventive care are important in promoting health and wellness.    Follow your health care provider's instructions about healthy diet, exercising, and getting tested or screened for diseases.  Follow your health care provider's instructions on monitoring your cholesterol and blood pressure. This information is not intended to replace advice given to you by your health care provider. Make sure you discuss any questions you have with your health care provider. Document Revised: 08/15/2018 Document Reviewed: 08/15/2018 Elsevier Patient Education  2021 Elsevier Inc.  

## 2020-11-11 LAB — LIPID PANEL
Chol/HDL Ratio: 3.3 ratio (ref 0.0–4.4)
Cholesterol, Total: 138 mg/dL (ref 100–199)
HDL: 42 mg/dL (ref >39)
LDL Chol Calc (NIH): 73 mg/dL (ref 0–99)
Triglycerides: 131 mg/dL (ref 0–149)
VLDL Cholesterol Cal: 23 mg/dL (ref 5–40)

## 2020-11-11 LAB — COMPREHENSIVE METABOLIC PANEL
ALT: 21 IU/L (ref 0–32)
AST: 20 IU/L (ref 0–40)
Albumin/Globulin Ratio: 1.2 (ref 1.2–2.2)
Albumin: 3.9 g/dL (ref 3.8–4.8)
Alkaline Phosphatase: 93 IU/L (ref 44–121)
BUN/Creatinine Ratio: 13 (ref 9–23)
BUN: 8 mg/dL (ref 6–20)
Bilirubin Total: 0.2 mg/dL (ref 0.0–1.2)
CO2: 19 mmol/L — ABNORMAL LOW (ref 20–29)
Calcium: 9.1 mg/dL (ref 8.7–10.2)
Chloride: 102 mmol/L (ref 96–106)
Creatinine, Ser: 0.64 mg/dL (ref 0.57–1.00)
Globulin, Total: 3.3 g/dL (ref 1.5–4.5)
Glucose: 101 mg/dL — ABNORMAL HIGH (ref 65–99)
Potassium: 4.1 mmol/L (ref 3.5–5.2)
Sodium: 140 mmol/L (ref 134–144)
Total Protein: 7.2 g/dL (ref 6.0–8.5)
eGFR: 115 mL/min/{1.73_m2} (ref >59)

## 2020-11-11 LAB — TESTT+TESTF+SHBG
Sex Hormone Binding: 35.2 nmol/L (ref 24.6–122.0)
Testosterone, Free: 1.2 pg/mL (ref 0.0–4.2)
Testosterone, Total, LC/MS: 12.6 ng/dL (ref 10.0–55.0)

## 2020-11-11 LAB — TSH: TSH: 1.54 u[IU]/mL (ref 0.450–4.500)

## 2020-12-28 ENCOUNTER — Ambulatory Visit (INDEPENDENT_AMBULATORY_CARE_PROVIDER_SITE_OTHER): Payer: Managed Care, Other (non HMO) | Admitting: Internal Medicine

## 2020-12-28 ENCOUNTER — Other Ambulatory Visit: Payer: Self-pay

## 2020-12-28 ENCOUNTER — Encounter: Payer: Self-pay | Admitting: Internal Medicine

## 2020-12-28 VITALS — BP 104/70 | HR 90 | Ht 62.0 in | Wt 133.2 lb

## 2020-12-28 DIAGNOSIS — E282 Polycystic ovarian syndrome: Secondary | ICD-10-CM | POA: Diagnosis not present

## 2020-12-28 MED ORDER — SPIRONOLACTONE 50 MG PO TABS: 50.0000 mg | ORAL_TABLET | Freq: Two times a day (BID) | ORAL | 6 refills | Status: DC

## 2020-12-28 NOTE — Progress Notes (Signed)
Name: Penny Bishop  MRN/ DOB: [id number], 11/10/1980    Age/ Sex: 40 y.o., female    PCP: Penny Pollen, MD   Provider Requesting Consultation: Penny Pollen, MD    Reason for Endocrinology Evaluation: PCOS     Date of Initial Endocrinology Evaluation: 12/28/2020     HPI: Penny Bishop is a 40 y.o. female with a past medical history of PCOS, dyslipidemia  and Hypothyroidism. The patient presented for initial [place] endocrinology clinic visit on 12/28/2020 for consultative assistance with evaluation / management of PCOS   The patient indicates that she was first diagnosed with PCOS in ~2007 during evaluation for infertility . Work-up for this issue has thus far have included biochemical testing.  She was started on Metformin at the time.    The patient admits to menstrual irregularities. She started menarche around age 39 y.o. with menstrual cycles occuring  every 45 days.  Currently has an IUD ( [place]) which has regularized menstruation , will  be taken out by 04/2021  The patient has clinical symptoms of hyperandrogenism such as increased acne, hirsutism (excess terminal (thick pigmented) hair in a female distribution - typically including the chin, neck , but not around  periareolar area,  midsternum, and along the linea alba of the lower abdomen, or  female-pattern hair loss   The patient has experienced metabolic issues including insulin resistance with an A1c of 5.7% in 2018,  dyslipidemia, she is was on  atorvastatin but has been off since 2020 no obesity.     Lastly, the patient denies suffer from mood disorders such as depression, anxiety, eating disorders. She denies have family history of PCOS, ovarian cancer.    On note, she had a normal testosterone in 11/09/2020 at 12.6 ng/dL  Pap smear in 11/2019 - negative     HISTORY:  Past Medical History:  Past Medical History:  Diagnosis Date  . Hepatitis B ~ 2001  . Iron deficiency anemia   . Maternal fever during labor  03/20/2011  . Pneumonia 08/20/2014  . Pneumonia involving right lung 08/20/2014  . Status post repeat low transverse cesarean section 03/23/2011   Past Surgical History:  Past Surgical History:  Procedure Laterality Date  . CESAREAN SECTION  04/05/2009;   . CESAREAN SECTION  03/20/2011   Procedure: CESAREAN SECTION;  Surgeon: Frederico Hamman, MD;  Location: [facility];  Service: Gynecology;  Laterality: N/A;  cord ph 7.18, fundal massage by A. Green Therapist, sports  . COSMETIC SURGERY      Social History:  reports that she has never smoked. She has never used smokeless tobacco. She reports that she does not drink alcohol and does not use drugs. Family History:  Family History  Problem Relation Age of Onset  . Diabetes Mother   . Hypertension Mother   . Hypertension Father   . Diabetes Maternal Grandmother   . Hypertension Maternal Grandmother   . Diabetes Maternal Grandfather   . Hypertension Maternal Grandfather   . Hypertension Paternal Grandmother   . Diabetes Paternal Grandmother   . COPD Paternal Grandfather   . Cancer Paternal Grandfather        throat     HOME MEDICATIONS: Allergies as of 12/28/2020      Reactions   Chlorhexidine Gluconate Rash      Medication List       Accurate as of December 28, 2020 11:44 AM. If you have any questions, ask your nurse or doctor.  STOP taking these medications   atorvastatin 40 MG tablet Commonly known as: LIPITOR Stopped by: Dorita Sciara, MD   fluticasone 50 MCG/ACT nasal spray Commonly known as: FLONASE Stopped by: Dorita Sciara, MD   loratadine 10 MG tablet Commonly known as: CLARITIN Stopped by: Dorita Sciara, MD   meclizine 25 MG tablet Commonly known as: ANTIVERT Stopped by: Dorita Sciara, MD     TAKE these medications   albuterol 108 (90 Base) MCG/ACT inhaler Commonly known as: VENTOLIN HFA Inhale 2 puffs into the lungs every 6 (six) hours as needed for wheezing or shortness of breath.    Biotin 5000 MCG Caps Take 5,000 mcg by mouth daily.   iron polysaccharides 150 MG capsule Commonly known as: NIFEREX Take 150 mg by mouth daily.   levothyroxine 25 MCG tablet Commonly known as: SYNTHROID Take 25 mcg by mouth daily.   multivitamin with minerals Tabs tablet Take 1 tablet by mouth daily.   rizatriptan 5 MG tablet Commonly known as: Maxalt Take 1 tablet (5 mg total) by mouth as needed for migraine. May repeat in 2 hours if needed   spironolactone 50 MG tablet Commonly known as: ALDACTONE Take 1 tablet (50 mg total) by mouth 2 (two) times daily. Started by: Dorita Sciara, MD   Vitamin B Complex Tabs Take 1 tablet by mouth daily.   VITAMIN K2-VITAMIN D3 PO Take 1 tablet by mouth daily.         REVIEW OF SYSTEMS: A comprehensive ROS was conducted with the patient and is negative except as per HPI    OBJECTIVE:  VS: BP 104/70   Pulse 90   Ht _0  (1.575 m)   Wt 133 lb 4 oz (60.4 kg)   LMP 12/14/2020   SpO2 98%   BMI 24.37 kg/m    EXAM: General: Pt appears well and is in NAD  Eyes: External eye exam normal without stare, lid lag or exophthalmos.  EOM intact.    Neck: General: Supple without adenopathy. Thyroid: Thyroid size normal.  No goiter or nodules appreciated.   Lungs: Clear with good BS bilat with no rales, rhonchi, or wheezes  Heart: Auscultation: RRR.  Abdomen: Normoactive bowel sounds, soft, nontender, without masses or organomegaly palpable  Extremities:  BL LE: No pretibial edema normal ROM and strength.  Skin: Hair: She has been noted with thick dark hair around the chin/neck area. Has closed comedones on the back and less on the neck and face Skin Inspection: No rashes Skin Palpation: Skin temperature, texture, and thickness normal to palpation  Neuro: Cranial nerves: II - XII grossly intact  Motor: Normal strength throughout DTRs: 2+ and symmetric in UE without delay in relaxation phase  Mental Status: Judgment, insight:  Intact Orientation: Oriented to time, place, and person Mood and affect: No depression, anxiety, or agitation     DATA REVIEWED: Results for [name], [name] (MRN [id number]) as of 12/28/2020 11:53  Ref. Range 11/09/2020 09:56  Sodium Latest Ref Range: 134 - 144 mmol/L 140  Potassium Latest Ref Range: 3.5 - 5.2 mmol/L 4.1  Chloride Latest Ref Range: 96 - 106 mmol/L 102  CO2 Latest Ref Range: 20 - 29 mmol/L 19 (L)  Glucose Latest Ref Range: 65 - 99 mg/dL 101 (H)  BUN Latest Ref Range: 6 - 20 mg/dL 8  Creatinine Latest Ref Range: 0.57 - 1.00 mg/dL 0.64  Calcium Latest Ref Range: 8.7 - 10.2 mg/dL 9.1  BUN/Creatinine Ratio Latest Ref  Range: 9 - 23  13  eGFR Latest Ref Range: >59 mL/min/1.73 115  Alkaline Phosphatase Latest Ref Range: 44 - 121 IU/L 93  Albumin Latest Ref Range: 3.8 - 4.8 g/dL 3.9  Albumin/Globulin Ratio Latest Ref Range: 1.2 - 2.2  1.2  AST Latest Ref Range: 0 - 40 IU/L 20  ALT Latest Ref Range: 0 - 32 IU/L 21  Total Protein Latest Ref Range: 6.0 - 8.5 g/dL 7.2  Total Bilirubin Latest Ref Range: 0.0 - 1.2 mg/dL 0.2  Total CHOL/HDL Ratio Latest Ref Range: 0.0 - 4.4 ratio 3.3  Cholesterol, Total Latest Ref Range: 100 - 199 mg/dL 138  HDL Cholesterol Latest Ref Range: >39 mg/dL 42  Triglycerides Latest Ref Range: 0 - 149 mg/dL 131  VLDL Cholesterol Cal Latest Ref Range: 5 - 40 mg/dL 23  LDL Chol Calc (NIH) Latest Ref Range: 0 - 99 mg/dL 73   Results for Schaner, Yocelyn "Vira Robben" (MRN [id number]) as of 12/28/2020 11:53  Ref. Range 11/09/2020 09:56  Sex Horm Binding Glob, Serum Latest Ref Range: 24.6 - 122.0 nmol/L 35.2  Testosterone Free Latest Ref Range: 0.0 - 4.2 pg/mL 1.2  TSH Latest Ref Range: 0.450 - 4.500 uIU/mL 1.540    ASSESSMENT/PLAN/RECOMMENDATIONS:   1. PCOS - She does meet 2 of 3 [place] criteria (oligo or amenorrhea, hyperandrogenism, polycystic ovaries per [place].   - The management of PCOS involves amelioration of hyperandrogenic symptoms,  metabolic abnormalities , prevention of endometrial hyperplasia and carcinoma as well as contraception for those not pursuing pregnancy.   - At this time she has an IUD and follows with Gynecology  - We discussed starting her on spironolactone for hirsutism and acne. We discussed the teratogenic effects of spironolactone and the importance of stopping it if she were to stop contraception.   - We discussed risk of metabolic disorders such as dyslipidemia , DM and CAD and emphasized the importance of lifestyle changes with weight loss, and exercise.    F/U in 6 months   CC: Penny Pollen, MD [place] [address] Phone: [phone] Fax: [phone]   Return to Endocrinology clinic as below: Future Appointments  Date Time Provider [place]  07/05/2021  8:10 AM Vi Whitesel, Melanie Crazier, MD [id number] None

## 2020-12-28 NOTE — Patient Instructions (Signed)
-   Start Spironolactone 50 mg, 1 tablet daily for 1 week, if no dizziness please increase to twice daily

## 2021-07-05 ENCOUNTER — Ambulatory Visit: Payer: Managed Care, Other (non HMO) | Admitting: Internal Medicine

## 2022-03-15 ENCOUNTER — Ambulatory Visit (INDEPENDENT_AMBULATORY_CARE_PROVIDER_SITE_OTHER): Payer: 59 | Admitting: Emergency Medicine

## 2022-03-15 ENCOUNTER — Encounter: Payer: Self-pay | Admitting: Emergency Medicine

## 2022-03-15 VITALS — BP 100/62 | HR 85 | Temp 98.2°F | Ht 62.0 in | Wt 136.5 lb

## 2022-03-15 DIAGNOSIS — E282 Polycystic ovarian syndrome: Secondary | ICD-10-CM | POA: Diagnosis not present

## 2022-03-15 DIAGNOSIS — Z1211 Encounter for screening for malignant neoplasm of colon: Secondary | ICD-10-CM

## 2022-03-15 DIAGNOSIS — Z1329 Encounter for screening for other suspected endocrine disorder: Secondary | ICD-10-CM | POA: Diagnosis not present

## 2022-03-15 DIAGNOSIS — Z1159 Encounter for screening for other viral diseases: Secondary | ICD-10-CM | POA: Diagnosis not present

## 2022-03-15 DIAGNOSIS — Z23 Encounter for immunization: Secondary | ICD-10-CM | POA: Diagnosis not present

## 2022-03-15 DIAGNOSIS — Z0000 Encounter for general adult medical examination without abnormal findings: Secondary | ICD-10-CM

## 2022-03-15 DIAGNOSIS — Z13228 Encounter for screening for other metabolic disorders: Secondary | ICD-10-CM

## 2022-03-15 DIAGNOSIS — Z130 Encounter for screening for diseases of the blood and blood-forming organs and certain disorders involving the immune mechanism: Secondary | ICD-10-CM

## 2022-03-15 DIAGNOSIS — Z800 Family history of malignant neoplasm of digestive organs: Secondary | ICD-10-CM

## 2022-03-15 DIAGNOSIS — Z13220 Encounter for screening for lipoid disorders: Secondary | ICD-10-CM | POA: Diagnosis not present

## 2022-03-15 LAB — CBC WITH DIFFERENTIAL/PLATELET
Basophils Absolute: 0 10*3/uL (ref 0.0–0.1)
Basophils Relative: 0.2 % (ref 0.0–3.0)
Eosinophils Absolute: 0.1 10*3/uL (ref 0.0–0.7)
Eosinophils Relative: 0.8 % (ref 0.0–5.0)
HCT: 35 % — ABNORMAL LOW (ref 36.0–46.0)
Hemoglobin: 11.5 g/dL — ABNORMAL LOW (ref 12.0–15.0)
Lymphocytes Relative: 23.8 % (ref 12.0–46.0)
Lymphs Abs: 1.9 10*3/uL (ref 0.7–4.0)
MCHC: 33 g/dL (ref 30.0–36.0)
MCV: 83.8 fl (ref 78.0–100.0)
Monocytes Absolute: 0.4 10*3/uL (ref 0.1–1.0)
Monocytes Relative: 4.4 % (ref 3.0–12.0)
Neutro Abs: 5.8 10*3/uL (ref 1.4–7.7)
Neutrophils Relative %: 70.8 % (ref 43.0–77.0)
Platelets: 209 10*3/uL (ref 150.0–400.0)
RBC: 4.17 Mil/uL (ref 3.87–5.11)
RDW: 14.8 % (ref 11.5–15.5)
WBC: 8.2 10*3/uL (ref 4.0–10.5)

## 2022-03-15 LAB — COMPREHENSIVE METABOLIC PANEL
ALT: 20 U/L (ref 0–35)
AST: 20 U/L (ref 0–37)
Albumin: 3.9 g/dL (ref 3.5–5.2)
Alkaline Phosphatase: 72 U/L (ref 39–117)
BUN: 9 mg/dL (ref 6–23)
CO2: 26 mEq/L (ref 19–32)
Calcium: 8.7 mg/dL (ref 8.4–10.5)
Chloride: 105 mEq/L (ref 96–112)
Creatinine, Ser: 0.61 mg/dL (ref 0.40–1.20)
GFR: 111.86 mL/min (ref >60.00)
Glucose, Bld: 109 mg/dL — ABNORMAL HIGH (ref 70–99)
Potassium: 3.7 mEq/L (ref 3.5–5.1)
Sodium: 138 mEq/L (ref 135–145)
Total Bilirubin: 0.4 mg/dL (ref 0.2–1.2)
Total Protein: 7 g/dL (ref 6.0–8.3)

## 2022-03-15 LAB — LIPID PANEL
Cholesterol: 118 mg/dL (ref 0–200)
HDL: 33.1 mg/dL — ABNORMAL LOW (ref >39.00)
LDL Cholesterol: 51 mg/dL (ref 0–99)
NonHDL: 84.9
Total CHOL/HDL Ratio: 4
Triglycerides: 168 mg/dL — ABNORMAL HIGH (ref 0.0–149.0)
VLDL: 33.6 mg/dL (ref 0.0–40.0)

## 2022-03-15 LAB — HEMOGLOBIN A1C: Hgb A1c MFr Bld: 6.1 % (ref 4.6–6.5)

## 2022-03-15 NOTE — Progress Notes (Signed)
Penny Bishop 41 y.o.   Chief Complaint  Patient presents with   Annual Exam    No concerns     HISTORY OF PRESENT ILLNESS: This is a 41 y.o. female here for annual exam. Past medical history includes PCOS, not taking Aldactone. Recently saw gynecologist.  Normal exam.  Normal Pap smear. Has no complaints or medical concerns today. Family history of colon cancer.  Needs referral for colonoscopy  HPI   Prior to Admission medications   Medication Sig Start Date End Date Taking? Authorizing Provider  albuterol (VENTOLIN HFA) 108 (90 Base) MCG/ACT inhaler Inhale 2 puffs into the lungs every 6 (six) hours as needed for wheezing or shortness of breath. 08/30/19   Orpah Cobb, MD  B Complex Vitamins (VITAMIN B COMPLEX) TABS Take 1 tablet by mouth daily.     [provider]  Biotin 5000 MCG CAPS Take 5,000 mcg by mouth daily.     [provider]  iron polysaccharides (NIFEREX) 150 MG capsule Take 150 mg by mouth daily.    [provider]  levothyroxine (SYNTHROID) 25 MCG tablet Take 25 mcg by mouth daily. Patient not taking: Reported on 12/28/2020 08/21/19   [provider]  Multiple Vitamin (MULTIVITAMIN WITH MINERALS) TABS tablet Take 1 tablet by mouth daily.    [provider]  rizatriptan (MAXALT) 5 MG tablet Take 1 tablet (5 mg total) by mouth as needed for migraine. May repeat in 2 hours if needed 05/09/19   [name], MD  spironolactone (ALDACTONE) 50 MG tablet Take 1 tablet (50 mg total) by mouth 2 (two) times daily. 12/28/20   [name], [name], MD  Vitamin D-Vitamin K (VITAMIN K2-VITAMIN D3 PO) Take 1 tablet by mouth daily.    [provider]    Allergies  Allergen Reactions   Chlorhexidine Gluconate Rash    Patient Active Problem List   Diagnosis Date Noted   Polycystic ovary syndrome 12/28/2020    Past Medical History:  Diagnosis Date   Hepatitis B ~ 2001   Iron deficiency anemia    Maternal fever  during labor 03/20/2011   Pneumonia 08/20/2014   Pneumonia involving right lung 08/20/2014   Status post repeat low transverse cesarean section 03/23/2011    Past Surgical History:  Procedure Laterality Date   CESAREAN SECTION  04/05/2009;    CESAREAN SECTION  03/20/2011   Procedure: CESAREAN SECTION;  Surgeon: [name], MD;  Location: [facility];  Service: Gynecology;  Laterality: N/A;  cord ph 7.18, fundal massage by A. Green RN   COSMETIC SURGERY      Social History   Socioeconomic History   Marital status: Married    Spouse name: Not on file   Number of children: Not on file   Years of education: Not on file   Highest education level: Not on file  Occupational History   Occupation: medical technologist  Tobacco Use   Smoking status: Never   Smokeless tobacco: Never  Vaping Use   Vaping Use: Not on file  Substance and Sexual Activity   Alcohol use: No   Drug use: No   Sexual activity: Yes  Other Topics Concern   Not on file  Social History Narrative   Not on file   Social Determinants of Health   Financial Resource Strain: Not on file  Food Insecurity: Not on file  Transportation Needs: Not on file  Physical Activity: Not on file  Stress: Not on file  Social Connections:  Not on file  Intimate Partner Violence: Not on file    Family History  Problem Relation Age of Onset   Diabetes Mother    Hypertension Mother    Hypertension Father    Diabetes Maternal Grandmother    Hypertension Maternal Grandmother    Diabetes Maternal Grandfather    Hypertension Maternal Grandfather    Hypertension Paternal Grandmother    Diabetes Paternal Grandmother    COPD Paternal Grandfather    Cancer Paternal Grandfather        throat     Review of Systems  Constitutional: Negative.  Negative for chills and fever.  HENT: Negative.  Negative for congestion and sore throat.   Eyes: Negative.   Respiratory: Negative.  Negative for cough and shortness of breath.    Cardiovascular: Negative.  Negative for chest pain and palpitations.  Gastrointestinal: Negative.  Negative for abdominal pain, diarrhea, nausea and vomiting.  Genitourinary: Negative.   Musculoskeletal: Negative.   Skin: Negative.  Negative for rash.  Neurological:  Negative for dizziness.  All other systems reviewed and are negative.   Today's Vitals   03/15/22 0810  BP: 100/62  Pulse: 85  Temp: 98.2 F (36.8 C)  TempSrc: Oral  SpO2: 98%  Weight: 136 lb 8 oz (61.9 kg)  Height: 5\' 2"  (1.575 m)   Body mass index is 24.97 kg/m.  Physical Exam Vitals reviewed.  Constitutional:      Appearance: Normal appearance.  HENT:     Head: Normocephalic.     Right Ear: Tympanic membrane, ear canal and external ear normal.     Left Ear: Tympanic membrane, ear canal and external ear normal.     Mouth/Throat:     Mouth: Mucous membranes are moist.     Pharynx: Oropharynx is clear.  Eyes:     Extraocular Movements: Extraocular movements intact.     Conjunctiva/sclera: Conjunctivae normal.     Pupils: Pupils are equal, round, and reactive to light.  Cardiovascular:     Rate and Rhythm: Normal rate and regular rhythm.     Pulses: Normal pulses.     Heart sounds: Normal heart sounds.  Pulmonary:     Effort: Pulmonary effort is normal.     Breath sounds: Normal breath sounds.  Abdominal:     General: There is no distension.     Palpations: Abdomen is soft.     Tenderness: There is no abdominal tenderness.  Musculoskeletal:     Cervical back: No tenderness.     Right lower leg: No edema.     Left lower leg: No edema.  Lymphadenopathy:     Cervical: No cervical adenopathy.  Skin:    General: Skin is warm and dry.     Capillary Refill: Capillary refill takes less than 2 seconds.  Neurological:     General: No focal deficit present.     Mental Status: She is alert and oriented to person, place, and time.  Psychiatric:        Mood and Affect: Mood normal.        Behavior:  Behavior normal.      ASSESSMENT & PLAN: Problem List Items Addressed This Visit       Endocrine   Polycystic ovary syndrome   Other Visit Diagnoses     Routine general medical examination at a health care facility    -  Primary   Need for hepatitis C screening test       Relevant Orders   Hepatitis  C antibody screen   Screening for deficiency anemia       Relevant Orders   CBC with Differential   Screening for lipoid disorders       Relevant Orders   Lipid panel   Screening for endocrine, metabolic and immunity disorder       Relevant Orders   Comprehensive metabolic panel   Hemoglobin A1c   Need for vaccination       Relevant Orders   Tdap vaccine greater than or equal to 7yo IM   Colon cancer screening       Relevant Orders   Ambulatory referral to Gastroenterology   Family history of colon cancer       Relevant Orders   Ambulatory referral to Gastroenterology      Modifiable risk factors discussed with patient. Anticipatory guidance according to age provided. The following topics were also discussed: Social Determinants of Health Smoking.  Non-smoker Diet and nutrition.  Good eating habits Benefits of exercise Cancer screening and need for colon cancer screening with colonoscopy given her family history of colon cancer Vaccinations recommendations Cardiovascular risk assessment Mental health including depression and anxiety Fall and accident prevention  Patient Instructions  Health Maintenance, Female Adopting a healthy lifestyle and getting preventive care are important in promoting health and wellness. Ask your health care provider about: The right schedule for you to have regular tests and exams. Things you can do on your own to prevent diseases and keep yourself healthy. What should I know about diet, weight, and exercise? Eat a healthy diet  Eat a diet that includes plenty of vegetables, fruits, low-fat dairy products, and lean protein. Do not  eat a lot of foods that are high in solid fats, added sugars, or sodium. Maintain a healthy weight Body mass index (BMI) is used to identify weight problems. It estimates body fat based on height and weight. Your health care provider can help determine your BMI and help you achieve or maintain a healthy weight. Get regular exercise Get regular exercise. This is one of the most important things you can do for your health. Most adults should: Exercise for at least 150 minutes each week. The exercise should increase your heart rate and make you sweat (moderate-intensity exercise). Do strengthening exercises at least twice a week. This is in addition to the moderate-intensity exercise. Spend less time sitting. Even light physical activity can be beneficial. Watch cholesterol and blood lipids Have your blood tested for lipids and cholesterol at 40 years of age, then have this test every 5 years. Have your cholesterol levels checked more often if: Your lipid or cholesterol levels are high. You are older than 40 years of age. You are at high risk for heart disease. What should I know about cancer screening? Depending on your health history and family history, you may need to have cancer screening at various ages. This may include screening for: Breast cancer. Cervical cancer. Colorectal cancer. Skin cancer. Lung cancer. What should I know about heart disease, diabetes, and high blood pressure? Blood pressure and heart disease High blood pressure causes heart disease and increases the risk of stroke. This is more likely to develop in people who have high blood pressure readings or are overweight. Have your blood pressure checked: Every 3-5 years if you are 37-100 years of age. Every year if you are 61 years old or older. Diabetes Have regular diabetes screenings. This checks your fasting blood sugar level. Have the screening done: Once every three  years after age 80 if you are at a normal weight  and have a low risk for diabetes. More often and at a younger age if you are overweight or have a high risk for diabetes. What should I know about preventing infection? Hepatitis B If you have a higher risk for hepatitis B, you should be screened for this virus. Talk with your health care provider to find out if you are at risk for hepatitis B infection. Hepatitis C Testing is recommended for: Everyone born from [birth year] through 1964. Anyone with known risk factors for hepatitis C. Sexually transmitted infections (STIs) Get screened for STIs, including gonorrhea and chlamydia, if: You are sexually active and are younger than 40 years of age. You are older than 40 years of age and your health care provider tells you that you are at risk for this type of infection. Your sexual activity has changed since you were last screened, and you are at increased risk for chlamydia or gonorrhea. Ask your health care provider if you are at risk. Ask your health care provider about whether you are at high risk for HIV. Your health care provider may recommend a prescription medicine to help prevent HIV infection. If you choose to take medicine to prevent HIV, you should first get tested for HIV. You should then be tested every 3 months for as long as you are taking the medicine. Pregnancy If you are about to stop having your period (premenopausal) and you may become pregnant, seek counseling before you get pregnant. Take 400 to 800 micrograms (mcg) of folic acid every day if you become pregnant. Ask for birth control (contraception) if you want to prevent pregnancy. Osteoporosis and menopause Osteoporosis is a disease in which the bones lose minerals and strength with aging. This can result in bone fractures. If you are 72 years old or older, or if you are at risk for osteoporosis and fractures, ask your health care provider if you should: Be screened for bone loss. Take a calcium or vitamin D supplement to lower  your risk of fractures. Be given hormone replacement therapy (HRT) to treat symptoms of menopause. Follow these instructions at home: Alcohol use Do not drink alcohol if: Your health care provider tells you not to drink. You are pregnant, may be pregnant, or are planning to become pregnant. If you drink alcohol: Limit how much you have to: 0-1 drink a day. Know how much alcohol is in your drink. In the [place] one drink equals one 12 oz bottle of beer (355 mL), one 5 oz glass of wine (148 mL), or one 1 oz glass of hard liquor (44 mL). Lifestyle Do not use any products that contain nicotine or tobacco. These products include cigarettes, chewing tobacco, and vaping devices, such as e-cigarettes. If you need help quitting, ask your health care provider. Do not use street drugs. Do not share needles. Ask your health care provider for help if you need support or information about quitting drugs. General instructions Schedule regular health, dental, and eye exams. Stay current with your vaccines. Tell your health care provider if: You often feel depressed. You have ever been abused or do not feel safe at home. Summary Adopting a healthy lifestyle and getting preventive care are important in promoting health and wellness. Follow your health care provider's instructions about healthy diet, exercising, and getting tested or screened for diseases. Follow your health care provider's instructions on monitoring your cholesterol and blood pressure. This information is not intended  to replace advice given to you by your health care provider. Make sure you discuss any questions you have with your health care provider. Document Revised: 01/11/2021 Document Reviewed: 01/11/2021 Elsevier Patient Education  2023 Elsevier Inc.      Edwina BarthMiguel Khadeja Abt, MD [facility]

## 2022-03-15 NOTE — Patient Instructions (Signed)

## 2022-03-16 LAB — HEPATITIS C ANTIBODY: Hepatitis C Ab: NONREACTIVE

## 2022-06-19 IMAGING — DX DG CHEST 2V
2 series · 2 of 2 positions shown · non-contrast
Comparison: 09/24/2019

CLINICAL DATA: Cough for the past 4-5 days. Nonsmoker.

EXAM:
CHEST - 2 VIEW

[dg chest 2 view (1 of 2)]
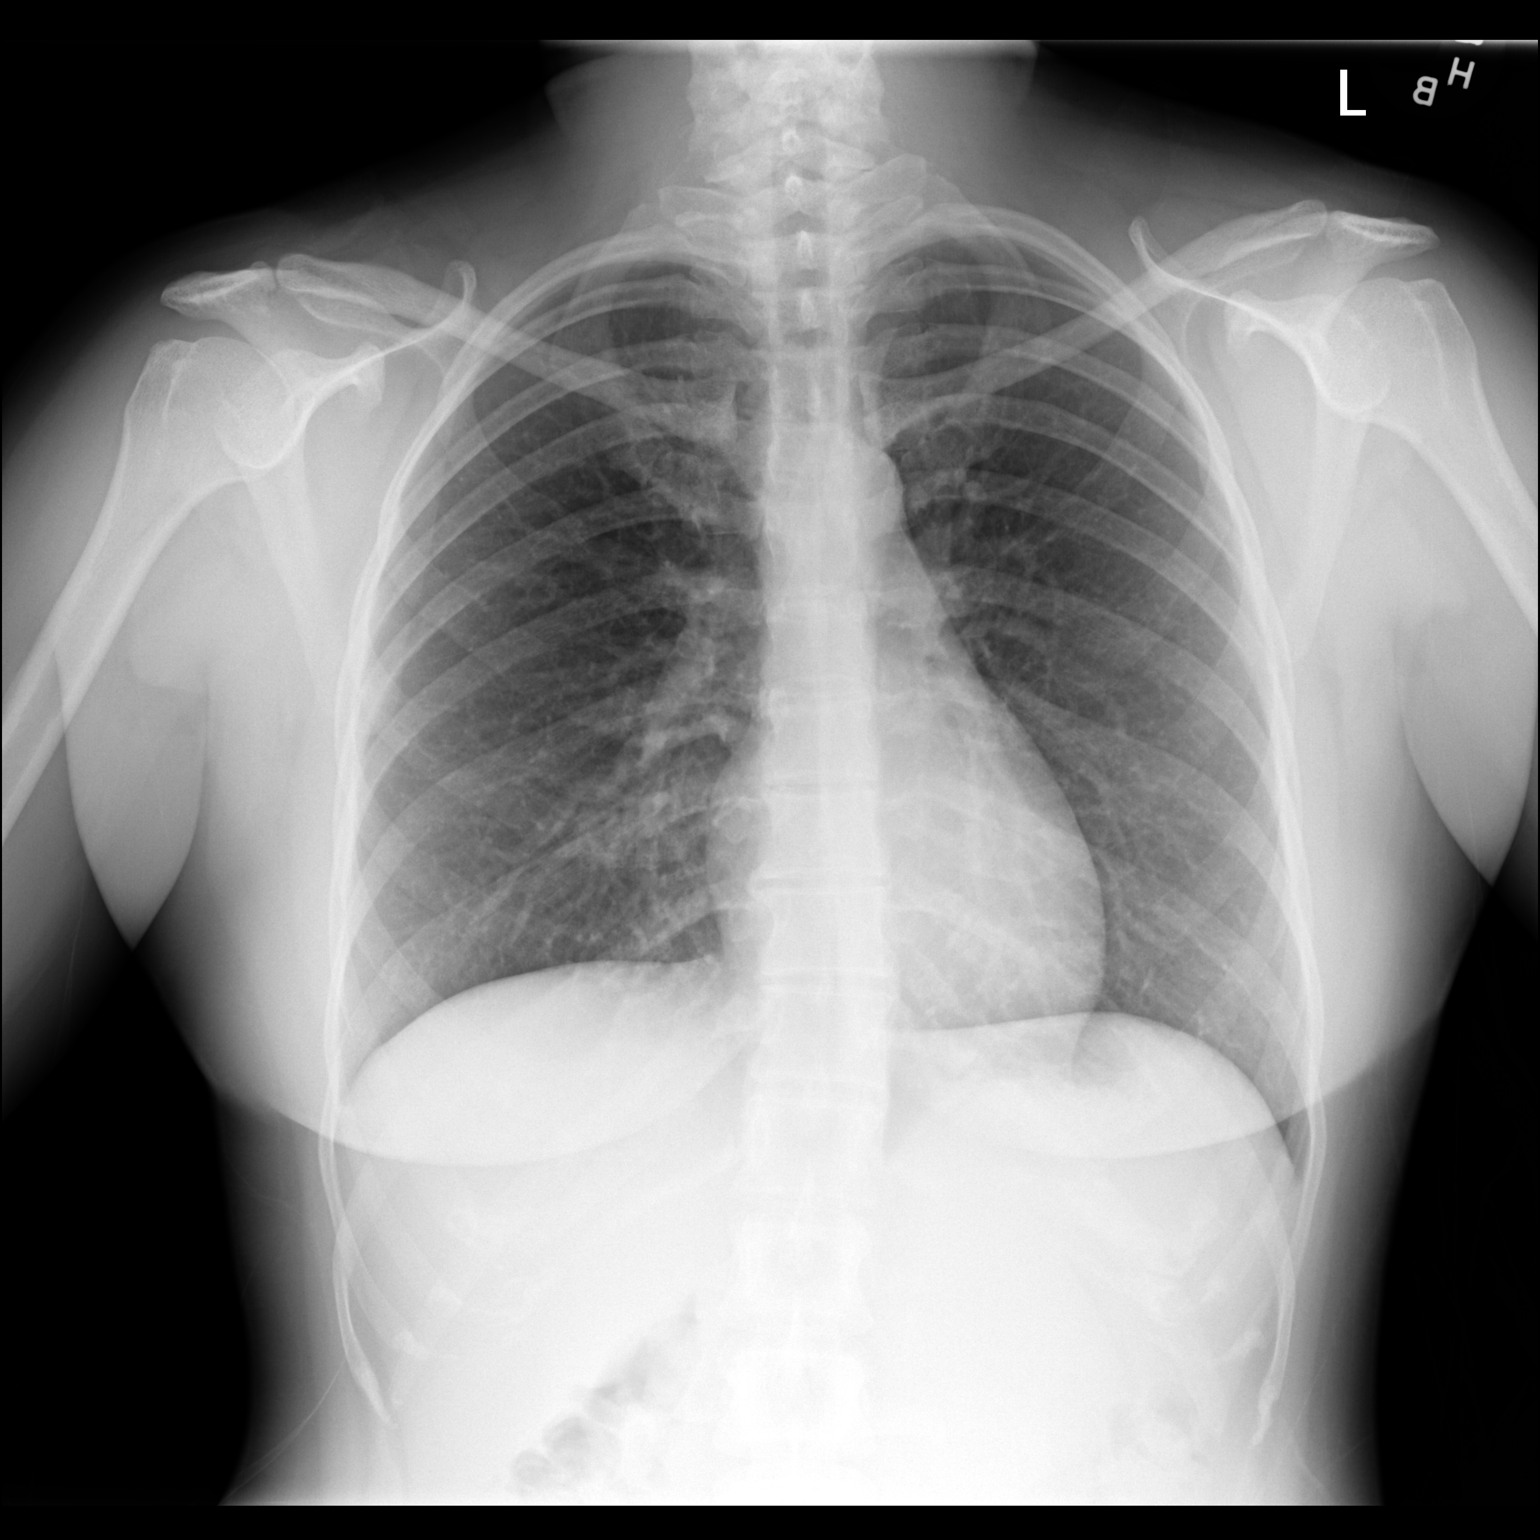

[dg chest 2 view (2 of 2)]
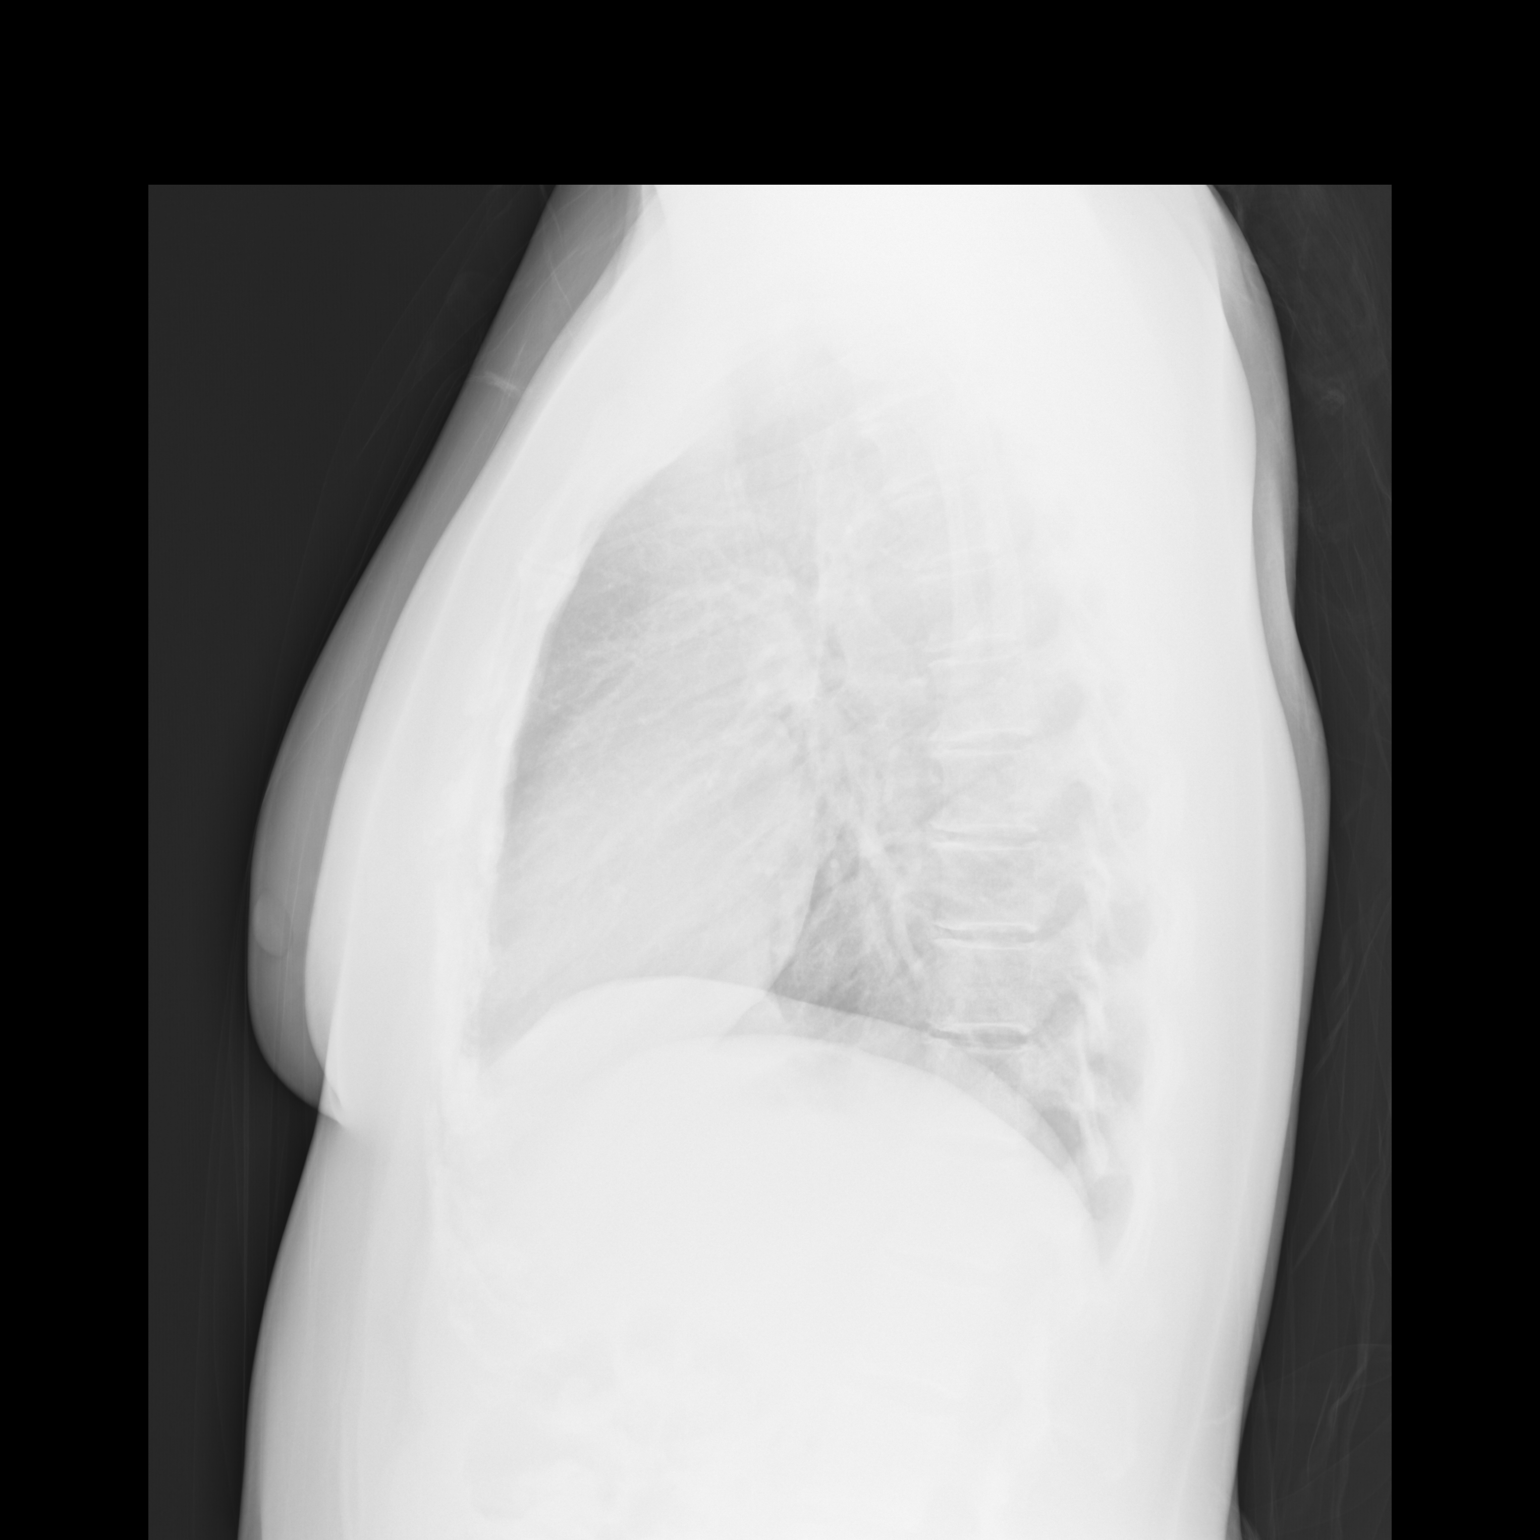

[2 of 2 positions shown; findings below may reference images not displayed]

FINDINGS: Normal sized heart. Clear lungs with normal vascularity. Mild
thoracic spine degenerative changes and minimal scoliosis.
IMPRESSION: No acute abnormality.

## 2023-03-20 ENCOUNTER — Encounter: Payer: Self-pay | Admitting: Emergency Medicine

## 2023-03-20 ENCOUNTER — Ambulatory Visit: Payer: Managed Care, Other (non HMO) | Admitting: Emergency Medicine

## 2023-03-20 VITALS — BP 116/80 | HR 87 | Temp 98.3°F | Ht 62.0 in | Wt 136.1 lb

## 2023-03-20 DIAGNOSIS — Z0000 Encounter for general adult medical examination without abnormal findings: Secondary | ICD-10-CM | POA: Diagnosis not present

## 2023-03-20 DIAGNOSIS — Z1211 Encounter for screening for malignant neoplasm of colon: Secondary | ICD-10-CM | POA: Diagnosis not present

## 2023-03-20 DIAGNOSIS — Z1231 Encounter for screening mammogram for malignant neoplasm of breast: Secondary | ICD-10-CM | POA: Diagnosis not present

## 2023-03-20 DIAGNOSIS — Z1329 Encounter for screening for other suspected endocrine disorder: Secondary | ICD-10-CM

## 2023-03-20 DIAGNOSIS — Z130 Encounter for screening for diseases of the blood and blood-forming organs and certain disorders involving the immune mechanism: Secondary | ICD-10-CM

## 2023-03-20 DIAGNOSIS — Z13228 Encounter for screening for other metabolic disorders: Secondary | ICD-10-CM

## 2023-03-20 DIAGNOSIS — Z13220 Encounter for screening for lipoid disorders: Secondary | ICD-10-CM | POA: Diagnosis not present

## 2023-03-20 LAB — LIPID PANEL
Cholesterol: 116 mg/dL (ref 0–200)
HDL: 43.3 mg/dL (ref >39.00)
LDL Cholesterol: 49 mg/dL (ref 0–99)
NonHDL: 72.68
Total CHOL/HDL Ratio: 3
Triglycerides: 118 mg/dL (ref 0.0–149.0)
VLDL: 23.6 mg/dL (ref 0.0–40.0)

## 2023-03-20 LAB — COMPREHENSIVE METABOLIC PANEL
ALT: 19 U/L (ref 0–35)
AST: 18 U/L (ref 0–37)
Albumin: 3.9 g/dL (ref 3.5–5.2)
Alkaline Phosphatase: 76 U/L (ref 39–117)
BUN: 8 mg/dL (ref 6–23)
CO2: 25 mEq/L (ref 19–32)
Calcium: 9.2 mg/dL (ref 8.4–10.5)
Chloride: 106 mEq/L (ref 96–112)
Creatinine, Ser: 0.59 mg/dL (ref 0.40–1.20)
GFR: 111.96 mL/min (ref >60.00)
Glucose, Bld: 103 mg/dL — ABNORMAL HIGH (ref 70–99)
Potassium: 4.1 mEq/L (ref 3.5–5.1)
Sodium: 137 mEq/L (ref 135–145)
Total Bilirubin: 0.4 mg/dL (ref 0.2–1.2)
Total Protein: 7.5 g/dL (ref 6.0–8.3)

## 2023-03-20 LAB — CBC WITH DIFFERENTIAL/PLATELET
Basophils Absolute: 0 10*3/uL (ref 0.0–0.1)
Basophils Relative: 0.1 % (ref 0.0–3.0)
Eosinophils Absolute: 0.2 10*3/uL (ref 0.0–0.7)
Eosinophils Relative: 1.5 % (ref 0.0–5.0)
HCT: 36.5 % (ref 36.0–46.0)
Hemoglobin: 11.8 g/dL — ABNORMAL LOW (ref 12.0–15.0)
Lymphocytes Relative: 23.3 % (ref 12.0–46.0)
Lymphs Abs: 2.5 10*3/uL (ref 0.7–4.0)
MCHC: 32.3 g/dL (ref 30.0–36.0)
MCV: 81.8 fl (ref 78.0–100.0)
Monocytes Absolute: 0.5 10*3/uL (ref 0.1–1.0)
Monocytes Relative: 4.6 % (ref 3.0–12.0)
Neutro Abs: 7.6 10*3/uL (ref 1.4–7.7)
Neutrophils Relative %: 70.5 % (ref 43.0–77.0)
Platelets: 228 10*3/uL (ref 150.0–400.0)
RBC: 4.46 Mil/uL (ref 3.87–5.11)
RDW: 14.8 % (ref 11.5–15.5)
WBC: 10.8 10*3/uL — ABNORMAL HIGH (ref 4.0–10.5)

## 2023-03-20 LAB — TSH: TSH: 3.07 u[IU]/mL (ref 0.35–5.50)

## 2023-03-20 LAB — HEMOGLOBIN A1C: Hgb A1c MFr Bld: 6 % (ref 4.6–6.5)

## 2023-03-20 NOTE — Addendum Note (Signed)
Addended by: Jerrell Belfast on: 03/20/2023 09:07 AM   Modules accepted: Orders

## 2023-03-20 NOTE — Patient Instructions (Signed)

## 2023-03-20 NOTE — Progress Notes (Signed)
Penny Bishop 42 y.o.   Chief Complaint  Patient presents with   Annual Exam    HISTORY OF PRESENT ILLNESS: This is a 42 y.o. female here for annual exam Healthy female with a healthy lifestyle Has no complaints or medical concerns today.  HPI   Prior to Admission medications   Medication Sig Start Date End Date Taking? Authorizing Provider  albuterol (VENTOLIN HFA) 108 (90 Base) MCG/ACT inhaler Inhale 2 puffs into the lungs every 6 (six) hours as needed for wheezing or shortness of breath. Patient not taking: Reported on 03/15/2022 08/30/19   Orpah Cobb, MD  B Complex Vitamins (VITAMIN B COMPLEX) TABS Take 1 tablet by mouth daily.  Patient not taking: Reported on 03/20/2023    [provider]  Biotin 5000 MCG CAPS Take 5,000 mcg by mouth daily.  Patient not taking: Reported on 03/20/2023    [provider]  iron polysaccharides (NIFEREX) 150 MG capsule Take 150 mg by mouth daily. Patient not taking: Reported on 03/20/2023    [provider]  levothyroxine (SYNTHROID) 25 MCG tablet Take 25 mcg by mouth daily. Patient not taking: Reported on 12/28/2020 08/21/19   [provider]  Multiple Vitamin (MULTIVITAMIN WITH MINERALS) TABS tablet Take 1 tablet by mouth daily. Patient not taking: Reported on 03/20/2023    [provider]  rizatriptan (MAXALT) 5 MG tablet Take 1 tablet (5 mg total) by mouth as needed for migraine. May repeat in 2 hours if needed Patient not taking: Reported on 03/15/2022 05/09/19   [name], MD  spironolactone (ALDACTONE) 50 MG tablet Take 1 tablet (50 mg total) by mouth 2 (two) times daily. Patient not taking: Reported on 03/15/2022 12/28/20   [name], [name], MD  Vitamin D-Vitamin K (VITAMIN K2-VITAMIN D3 PO) Take 1 tablet by mouth daily. Patient not taking: Reported on 03/20/2023    [provider]    Allergies  Allergen Reactions   Chlorhexidine Gluconate Rash    Patient Active Problem  List   Diagnosis Date Noted   Polycystic ovary syndrome 12/28/2020    Past Medical History:  Diagnosis Date   Hepatitis B ~ 2001   Iron deficiency anemia    Maternal fever during labor 03/20/2011   Pneumonia 08/20/2014   Pneumonia involving right lung 08/20/2014   Status post repeat low transverse cesarean section 03/23/2011    Past Surgical History:  Procedure Laterality Date   CESAREAN SECTION  04/05/2009;    CESAREAN SECTION  03/20/2011   Procedure: CESAREAN SECTION;  Surgeon: [name], MD;  Location: [facility];  Service: Gynecology;  Laterality: N/A;  cord ph 7.18, fundal massage by A. Green RN   COSMETIC SURGERY      Social History   Socioeconomic History   Marital status: Married    Spouse name: Not on file   Number of children: Not on file   Years of education: Not on file   Highest education level: Not on file  Occupational History   Occupation: medical technologist  Tobacco Use   Smoking status: Never   Smokeless tobacco: Never  Vaping Use   Vaping status: Not on file  Substance and Sexual Activity   Alcohol use: No   Drug use: No   Sexual activity: Yes  Other Topics Concern   Not on file  Social History Narrative   Not on file   Social Determinants of Health   Financial Resource Strain: Not on file  Food Insecurity: Not on file  Transportation Needs: Not on file  Physical Activity: Not on file  Stress: Not on file  Social Connections: Unknown (10/07/2022)   [facility]   Social Network    Social Network: Not on file  Intimate Partner Violence: Unknown (10/07/2022)   Received from [facility]   HITS    Physically Hurt: Not on file    Insult or Talk Down To: Not on file    Threaten Physical Harm: Not on file    Scream or Curse: Not on file    Family History  Problem Relation Age of Onset   Diabetes Mother    Hypertension Mother    Hypertension Father    Diabetes Maternal Grandmother    Hypertension Maternal Grandmother     Diabetes Maternal Grandfather    Hypertension Maternal Grandfather    Hypertension Paternal Grandmother    Diabetes Paternal Grandmother    COPD Paternal Grandfather    Cancer Paternal Grandfather        throat     Review of Systems  Constitutional: Negative.  Negative for chills and fever.  HENT: Negative.  Negative for congestion and sore throat.   Respiratory: Negative.  Negative for cough and shortness of breath.   Cardiovascular: Negative.  Negative for chest pain and palpitations.  Gastrointestinal:  Negative for abdominal pain, nausea and vomiting.  Genitourinary: Negative.  Negative for dysuria and hematuria.  Skin: Negative.  Negative for rash.  Neurological:  Negative for dizziness and headaches.  All other systems reviewed and are negative.   Vitals:   03/20/23 0817  BP: 116/80  Pulse: 87  Temp: 98.3 F (36.8 C)  SpO2: 98%    Physical Exam Vitals reviewed.  Constitutional:      Appearance: Normal appearance.  HENT:     Head: Normocephalic.     Right Ear: Tympanic membrane, ear canal and external ear normal.     Left Ear: Tympanic membrane, ear canal and external ear normal.     Mouth/Throat:     Mouth: Mucous membranes are moist.     Pharynx: Oropharynx is clear.  Eyes:     Extraocular Movements: Extraocular movements intact.     Conjunctiva/sclera: Conjunctivae normal.     Pupils: Pupils are equal, round, and reactive to light.  Cardiovascular:     Rate and Rhythm: Normal rate and regular rhythm.     Pulses: Normal pulses.     Heart sounds: Normal heart sounds.  Pulmonary:     Effort: Pulmonary effort is normal.     Breath sounds: Normal breath sounds.  Abdominal:     Palpations: Abdomen is soft.     Tenderness: There is no abdominal tenderness.  Musculoskeletal:     Cervical back: No tenderness.  Lymphadenopathy:     Cervical: No cervical adenopathy.  Skin:    General: Skin is warm and dry.     Capillary Refill: Capillary refill takes  less than 2 seconds.  Neurological:     General: No focal deficit present.     Mental Status: She is alert and oriented to person, place, and time.  Psychiatric:        Mood and Affect: Mood normal.        Behavior: Behavior normal.      ASSESSMENT & PLAN: Problem List Items Addressed This Visit   None Visit Diagnoses     Routine general medical examination at a health care facility    -  Primary   Relevant Orders  Ambulatory referral to Obstetrics / Gynecology   CBC with Differential   Comprehensive metabolic panel   Hemoglobin A1c   Lipid panel   TSH   Visit for screening mammogram       Relevant Orders   Mammogram Digital Screening   Colon cancer screening       Relevant Orders   Ambulatory referral to Gastroenterology   Screening for deficiency anemia       Relevant Orders   CBC with Differential   Screening for lipoid disorders       Relevant Orders   Lipid panel   Screening for endocrine, metabolic and immunity disorder       Relevant Orders   Comprehensive metabolic panel   Hemoglobin A1c   TSH      Modifiable risk factors discussed with patient. Anticipatory guidance according to age provided. The following topics were also discussed: Social Determinants of Health Smoking.  Non-smoker Diet and nutrition.  Good eating habits Benefits of exercise Cancer screening and need for breast and colon cancer screening with mammogram and colonoscopy Vaccinations review and recommendations Cardiovascular risk assessment and need for blood work Mental health including depression and anxiety Fall and accident prevention  Patient Instructions  Health Maintenance, Female Adopting a healthy lifestyle and getting preventive care are important in promoting health and wellness. Ask your health care provider about: The right schedule for you to have regular tests and exams. Things you can do on your own to prevent diseases and keep yourself healthy. What should I know  about diet, weight, and exercise? Eat a healthy diet  Eat a diet that includes plenty of vegetables, fruits, low-fat dairy products, and lean protein. Do not eat a lot of foods that are high in solid fats, added sugars, or sodium. Maintain a healthy weight Body mass index (BMI) is used to identify weight problems. It estimates body fat based on height and weight. Your health care provider can help determine your BMI and help you achieve or maintain a healthy weight. Get regular exercise Get regular exercise. This is one of the most important things you can do for your health. Most adults should: Exercise for at least 150 minutes each week. The exercise should increase your heart rate and make you sweat (moderate-intensity exercise). Do strengthening exercises at least twice a week. This is in addition to the moderate-intensity exercise. Spend less time sitting. Even light physical activity can be beneficial. Watch cholesterol and blood lipids Have your blood tested for lipids and cholesterol at 41 years of age, then have this test every 5 years. Have your cholesterol levels checked more often if: Your lipid or cholesterol levels are high. You are older than 41 years of age. You are at high risk for heart disease. What should I know about cancer screening? Depending on your health history and family history, you may need to have cancer screening at various ages. This may include screening for: Breast cancer. Cervical cancer. Colorectal cancer. Skin cancer. Lung cancer. What should I know about heart disease, diabetes, and high blood pressure? Blood pressure and heart disease High blood pressure causes heart disease and increases the risk of stroke. This is more likely to develop in people who have high blood pressure readings or are overweight. Have your blood pressure checked: Every 3-5 years if you are 64-9 years of age. Every year if you are 89 years old or older. Diabetes Have  regular diabetes screenings. This checks your fasting blood sugar level. Have  the screening done: Once every three years after age 54 if you are at a normal weight and have a low risk for diabetes. More often and at a younger age if you are overweight or have a high risk for diabetes. What should I know about preventing infection? Hepatitis B If you have a higher risk for hepatitis B, you should be screened for this virus. Talk with your health care provider to find out if you are at risk for hepatitis B infection. Hepatitis C Testing is recommended for: Everyone born from [birth year] through 1964. Anyone with known risk factors for hepatitis C. Sexually transmitted infections (STIs) Get screened for STIs, including gonorrhea and chlamydia, if: You are sexually active and are younger than 41 years of age. You are older than 41 years of age and your health care provider tells you that you are at risk for this type of infection. Your sexual activity has changed since you were last screened, and you are at increased risk for chlamydia or gonorrhea. Ask your health care provider if you are at risk. Ask your health care provider about whether you are at high risk for HIV. Your health care provider may recommend a prescription medicine to help prevent HIV infection. If you choose to take medicine to prevent HIV, you should first get tested for HIV. You should then be tested every 3 months for as long as you are taking the medicine. Pregnancy If you are about to stop having your period (premenopausal) and you may become pregnant, seek counseling before you get pregnant. Take 400 to 800 micrograms (mcg) of folic acid every day if you become pregnant. Ask for birth control (contraception) if you want to prevent pregnancy. Osteoporosis and menopause Osteoporosis is a disease in which the bones lose minerals and strength with aging. This can result in bone fractures. If you are 1 years old or older, or if you  are at risk for osteoporosis and fractures, ask your health care provider if you should: Be screened for bone loss. Take a calcium or vitamin D supplement to lower your risk of fractures. Be given hormone replacement therapy (HRT) to treat symptoms of menopause. Follow these instructions at home: Alcohol use Do not drink alcohol if: Your health care provider tells you not to drink. You are pregnant, may be pregnant, or are planning to become pregnant. If you drink alcohol: Limit how much you have to: 0-1 drink a day. Know how much alcohol is in your drink. In the [place] one drink equals one 12 oz bottle of beer (355 mL), one 5 oz glass of wine (148 mL), or one 1 oz glass of hard liquor (44 mL). Lifestyle Do not use any products that contain nicotine or tobacco. These products include cigarettes, chewing tobacco, and vaping devices, such as e-cigarettes. If you need help quitting, ask your health care provider. Do not use street drugs. Do not share needles. Ask your health care provider for help if you need support or information about quitting drugs. General instructions Schedule regular health, dental, and eye exams. Stay current with your vaccines. Tell your health care provider if: You often feel depressed. You have ever been abused or do not feel safe at home. Summary Adopting a healthy lifestyle and getting preventive care are important in promoting health and wellness. Follow your health care provider's instructions about healthy diet, exercising, and getting tested or screened for diseases. Follow your health care provider's instructions on monitoring your cholesterol and blood pressure.  This information is not intended to replace advice given to you by your health care provider. Make sure you discuss any questions you have with your health care provider. Document Revised: 01/11/2021 Document Reviewed: 01/11/2021 Elsevier Patient Education  2024 [employer]     Edwina Barth, MD [facility]

## 2023-03-30 LAB — HM MAMMOGRAPHY

## 2023-03-31 ENCOUNTER — Encounter: Payer: Self-pay | Admitting: Emergency Medicine

## 2023-05-02 ENCOUNTER — Telehealth: Payer: Self-pay | Admitting: Emergency Medicine

## 2023-05-02 NOTE — Telephone Encounter (Signed)
FYI: [facility] called and stated that pt refused the referral because she does not need an OBGYN right now
